# Patient Record
Sex: Female | Born: 1980 | Race: White | Hispanic: No | Marital: Married | Smoking: Never smoker
Health system: Southern US, Community
[De-identification: ages and names within clinical notes are randomized; demographics above are authoritative.]

## PROBLEM LIST (undated history)

## (undated) DIAGNOSIS — M5020 Other cervical disc displacement, unspecified cervical region: Secondary | ICD-10-CM

## (undated) DIAGNOSIS — K760 Fatty (change of) liver, not elsewhere classified: Secondary | ICD-10-CM

## (undated) DIAGNOSIS — G4730 Sleep apnea, unspecified: Secondary | ICD-10-CM

## (undated) DIAGNOSIS — M1990 Unspecified osteoarthritis, unspecified site: Secondary | ICD-10-CM

## (undated) DIAGNOSIS — G43909 Migraine, unspecified, not intractable, without status migrainosus: Secondary | ICD-10-CM

## (undated) DIAGNOSIS — M5030 Other cervical disc degeneration, unspecified cervical region: Secondary | ICD-10-CM

## (undated) DIAGNOSIS — K219 Gastro-esophageal reflux disease without esophagitis: Secondary | ICD-10-CM

## (undated) DIAGNOSIS — I10 Essential (primary) hypertension: Secondary | ICD-10-CM

## (undated) DIAGNOSIS — J45909 Unspecified asthma, uncomplicated: Secondary | ICD-10-CM

## (undated) DIAGNOSIS — R21 Rash and other nonspecific skin eruption: Secondary | ICD-10-CM

## (undated) HISTORY — DX: Migraine, unspecified, not intractable, without status migrainosus: G43.909

## (undated) HISTORY — DX: Unspecified asthma, uncomplicated: J45.909

## (undated) HISTORY — DX: Unspecified osteoarthritis, unspecified site: M19.90

## (undated) HISTORY — DX: Essential (primary) hypertension: I10

## (undated) HISTORY — DX: Other cervical disc degeneration, unspecified cervical region: M50.30

---

## 2005-10-25 ENCOUNTER — Emergency Department: Payer: Self-pay | Admitting: Emergency Medicine

## 2006-04-25 ENCOUNTER — Emergency Department: Payer: Self-pay | Admitting: Emergency Medicine

## 2006-12-01 HISTORY — PX: ABDOMINAL HYSTERECTOMY: SHX81

## 2007-06-03 ENCOUNTER — Emergency Department: Payer: Self-pay

## 2010-06-04 ENCOUNTER — Ambulatory Visit: Payer: Self-pay | Admitting: Unknown Physician Specialty

## 2010-06-06 ENCOUNTER — Ambulatory Visit: Payer: Self-pay | Admitting: Unknown Physician Specialty

## 2010-06-10 LAB — PATHOLOGY REPORT

## 2010-07-26 ENCOUNTER — Ambulatory Visit: Payer: Self-pay | Admitting: Unknown Physician Specialty

## 2013-11-21 ENCOUNTER — Emergency Department: Payer: Self-pay | Admitting: Emergency Medicine

## 2015-03-14 ENCOUNTER — Encounter: Payer: Self-pay | Admitting: *Deleted

## 2015-04-30 ENCOUNTER — Emergency Department
Admission: EM | Admit: 2015-04-30 | Discharge: 2015-04-30 | Disposition: A | Discharge status: Home / Self Care | Payer: 59 | Attending: Emergency Medicine | Admitting: Emergency Medicine

## 2015-04-30 ENCOUNTER — Encounter: Payer: Self-pay | Admitting: Emergency Medicine

## 2015-04-30 DIAGNOSIS — H1013 Acute atopic conjunctivitis, bilateral: Secondary | ICD-10-CM | POA: Insufficient documentation

## 2015-04-30 DIAGNOSIS — H578 Other specified disorders of eye and adnexa: Secondary | ICD-10-CM | POA: Diagnosis present

## 2015-04-30 MED ORDER — TETRACAINE HCL 0.5 % OP SOLN
OPHTHALMIC | Status: AC
  Filled 2015-04-30: qty 2

## 2015-04-30 MED ORDER — FLUORESCEIN SODIUM 1 MG OP STRP
ORAL_STRIP | OPHTHALMIC | Status: AC
  Filled 2015-04-30: qty 1

## 2015-04-30 MED ORDER — DIPHENHYDRAMINE HCL 50 MG PO CAPS
ORAL_CAPSULE | ORAL | Status: AC
  Administered 2015-04-30: 50 mg via ORAL
  Filled 2015-04-30: qty 1

## 2015-04-30 MED ORDER — DIPHENHYDRAMINE HCL 50 MG PO CAPS
50.0000 mg | ORAL_CAPSULE | Freq: Once | ORAL | Status: AC
  Administered 2015-04-30: 50 mg via ORAL

## 2015-04-30 MED ORDER — EYE WASH OPHTH SOLN
OPHTHALMIC | Status: AC
  Filled 2015-04-30: qty 118

## 2015-04-30 MED ORDER — OLOPATADINE HCL 0.1 % OP SOLN: 1.0000 [drp] | Freq: Two times a day (BID) | OPHTHALMIC | Status: DC

## 2015-04-30 NOTE — ED Notes (Signed)
Was at pet shop and held animals right before

## 2015-04-30 NOTE — ED Provider Notes (Signed)
[facility] Emergency Department Provider Note  ____________________________________________  Time seen: 1400  I have reviewed the triage vital signs and the nursing notes.   HISTORY  Chief Complaint Eye Problem   HPI Victoria Silva is a 34 y.o. female comes in after being at a pet shop in [place].  She states after having a rabbit and a [place] pig she walked out of the store and rubbed her eye. Shortly after that her right eye began to swell. There is been no change in vision. She has clear watery drainage from her right eye and it itches severely. She did not take any over-the-counter medication but came straight to the emergency room. She denies any previous allergies to animals. Currently she rates the pain mostly from itching 7 out of 10.   History reviewed. No pertinent past medical history.  There are no active problems to display for this patient.   No past surgical history on file.  Current Outpatient Rx  Name  Route  Sig  Dispense  Refill  . olopatadine (PATANOL) 0.1 % ophthalmic solution   Both Eyes   Place 1 drop into both eyes 2 (two) times daily.   5 mL   1     Allergies Review of patient's allergies indicates not on file.  No family history on file.  Social History History  Substance Use Topics  . Smoking status: Never Smoker   . Smokeless tobacco: Not on file  . Alcohol Use: No    Review of Systems Constitutional: No fever/chills Eyes: No visual changes. ENT: No sore throat. Cardiovascular: Denies chest pain. Respiratory: Denies shortness of breath. Gastrointestinal: No abdominal pain.  No nausea, no vomiting Skin: Negative for rash. Neurological: Negative for headaches, focal weakness or numbness.  10-point ROS otherwise negative.  ____________________________________________   PHYSICAL EXAM:  VITAL SIGNS: ED Triage Vitals  Enc Vitals Group     BP 04/30/15 1348 138/93 mmHg     Pulse Rate 04/30/15 1348 110     Resp 04/30/15 1348 20     Temp 04/30/15 1348 98.2 F (36.8 C)     Temp Source 04/30/15 1348 Oral     SpO2 04/30/15 1348 99 %     Weight 04/30/15 1348 215 lb (97.523 kg)     Height 04/30/15 1348  (1.575 m)     Head Cir --      Peak Flow --      Pain Score 04/30/15 1349 7     Pain Loc --      Pain Edu? --      Excl. in GC? --     Constitutional: Alert and oriented. Well appearing and in no acute distress. Eyes: Right conjunctiva is moderately injected with clear watery drainage from it. There is some mild scleral edema. No foreign body was noted. PERRL. EOMI. Head: Atraumatic. Nose: No congestion/rhinnorhea. Neck: No stridor.  Supple Hematological/Lymphatic/Immunilogical: No cervical lymphadenopathy. Cardiovascular: Normal rate, regular rhythm. Grossly normal heart sounds.  Good peripheral circulation. Respiratory: Normal respiratory effort.  No retractions. Lungs CTAB. Musculoskeletal: No lower extremity tenderness nor edema.  No joint effusions. Neurologic:  Normal speech and language. No gross focal neurologic deficits are appreciated. Speech is normal. No gait instability. Skin:  Skin is warm, dry and intact. No rash noted. Psychiatric: Mood and affect are normal. Speech and behavior are normal.  ____________________________________________   LABS (all labs ordered are listed, but only abnormal results are displayed)  Labs Reviewed - No  data to display ____________________________________________  PROCEDURES  Procedure(s) performed: Tetracaine was dropped in both eyes. Lids were inverted bilaterally with no foreign body noted. Fluro-dye was placed bilaterally without any corneal abrasions noted. Both eyes were irrigated with eyewash.  Critical Care performed: No  ____________________________________________   INITIAL IMPRESSION / ASSESSMENT AND PLAN / ED COURSE  Pertinent labs & imaging results that were available during my care of the patient were reviewed by  me and considered in my medical decision making (see chart for details).  Patient was given Benadryl 50 mg by mouth. After I exam she was prescribed Patanol twice a day. His continue using Benadryl as needed for itching and follow-up with [facility] if any continued problems. ____________________________________________   FINAL CLINICAL IMPRESSION(S) / ED DIAGNOSES  Final diagnoses:  Allergic conjunctivitis of both eyes      Tommi RumpsRhonda L Summers, PA-C 04/30/15 1619  Darien Ramusavid W Kaminski, MD 05/01/15 1210

## 2015-04-30 NOTE — Discharge Instructions (Signed)
Allergic Conjunctivitis  The conjunctiva is a thin membrane that covers the visible white part of the eyeball and the underside of the eyelids. This membrane protects and lubricates the eye. The membrane has small blood vessels running through it that can normally be seen. When the conjunctiva becomes inflamed, the condition is called conjunctivitis. In response to the inflammation, the conjunctival blood vessels become swollen. The swelling results in redness in the normally white part of the eye.  The blood vessels of this membrane also react when a person has allergies and is then called allergic conjunctivitis. This condition usually lasts for as long as the allergy persists. Allergic conjunctivitis cannot be passed to another person (non-contagious). The likelihood of bacterial infection is great and the cause is not likely due to allergies if the inflamed eye has:  · A sticky discharge.  · Discharge or sticking together of the lids in the morning.  · Scaling or flaking of the eyelids where the eyelashes come out.  · Red swollen eyelids.  CAUSES   · Viruses.  · Irritants such as foreign bodies.  · Chemicals.  · General allergic reactions.  · Inflammation or serious diseases in the inside or the outside of the eye or the orbit (the boney cavity in which the eye sits) can cause a "red eye."  SYMPTOMS   · Eye redness.  · Tearing.  · Itchy eyes.  · Burning feeling in the eyes.  · Clear drainage from the eye.  · Allergic reaction due to pollens or ragweed sensitivity. Seasonal allergic conjunctivitis is frequent in the spring when pollens are in the air and in the fall.  DIAGNOSIS   This condition, in its many forms, is usually diagnosed based on the history and an ophthalmological exam. It usually involves both eyes. If your eyes react at the same time every year, allergies may be the cause. While most "red eyes" are due to allergy or an infection, the role of an eye (ophthalmological) exam is important. The exam  can rule out serious diseases of the eye or orbit.  TREATMENT   · Non-antibiotic eye drops, ointments, or medications by mouth may be prescribed if the ophthalmologist is sure the conjunctivitis is due to allergies alone.  · Over-the-counter drops and ointments for allergic symptoms should be used only after other causes of conjunctivitis have been ruled out, or as your caregiver suggests.  Medications by mouth are often prescribed if other allergy-related symptoms are present. If the ophthalmologist is sure that the conjunctivitis is due to allergies alone, treatment is normally limited to drops or ointments to reduce itching and burning.  HOME CARE INSTRUCTIONS   · Wash hands before and after applying drops or ointments, or touching the inflamed eye(s) or eyelids.  · Do not let the eye dropper tip or ointment tube touch the eyelid when putting medicine in your eye.  · Stop using your soft contact lenses and throw them away. Use a new pair of lenses when recovery is complete. You should run through sterilizing cycles at least three times before use after complete recovery if the old soft contact lenses are to be used. Hard contact lenses should be stopped. They need to be thoroughly sterilized before use after recovery.  · Itching and burning eyes due to allergies is often relieved by using a cool cloth applied to closed eye(s).  SEEK MEDICAL CARE IF:   · Your problems do not go away after two or three days of treatment.  ·   Your lids are sticky (especially in the morning when you wake up) or stick together.  · Discharge develops. Antibiotics may be needed either as drops, ointment, or by mouth.  · You have extreme light sensitivity.  · An oral temperature above 102° F (38.9° C) develops.  · Pain in or around the eye or any other visual symptom develops.  MAKE SURE YOU:   · Understand these instructions.  · Will watch your condition.  · Will get help right away if you are not doing well or get worse.  Document  Released: 02/07/2003 Document Revised: 02/09/2012 Document Reviewed: 01/03/2008  ExitCare® Patient Information ©2015 [employer]. This information is not intended to replace advice given to you by your health care provider. Make sure you discuss any questions you have with your health care provider.

## 2015-08-09 ENCOUNTER — Other Ambulatory Visit: Payer: Self-pay | Admitting: Internal Medicine

## 2015-08-09 DIAGNOSIS — M542 Cervicalgia: Principal | ICD-10-CM

## 2015-08-09 DIAGNOSIS — G8929 Other chronic pain: Secondary | ICD-10-CM

## 2015-08-10 ENCOUNTER — Other Ambulatory Visit: Payer: Self-pay | Admitting: Internal Medicine

## 2015-08-10 DIAGNOSIS — N6452 Nipple discharge: Secondary | ICD-10-CM

## 2015-08-10 DIAGNOSIS — N644 Mastodynia: Secondary | ICD-10-CM

## 2015-08-15 ENCOUNTER — Ambulatory Visit
Admission: RE | Admit: 2015-08-15 | Discharge: 2015-08-15 | Disposition: A | Discharge status: Home / Self Care | Payer: 59 | Source: Ambulatory Visit | Attending: Internal Medicine | Admitting: Internal Medicine

## 2015-08-15 DIAGNOSIS — G8929 Other chronic pain: Secondary | ICD-10-CM | POA: Insufficient documentation

## 2015-08-15 DIAGNOSIS — M542 Cervicalgia: Secondary | ICD-10-CM | POA: Diagnosis present

## 2015-08-16 ENCOUNTER — Ambulatory Visit
Admission: RE | Admit: 2015-08-16 | Discharge: 2015-08-16 | Disposition: A | Discharge status: Home / Self Care | Payer: 59 | Source: Ambulatory Visit | Attending: Internal Medicine | Admitting: Internal Medicine

## 2015-08-16 ENCOUNTER — Other Ambulatory Visit: Payer: Self-pay | Admitting: Internal Medicine

## 2015-08-16 DIAGNOSIS — N6452 Nipple discharge: Secondary | ICD-10-CM | POA: Diagnosis not present

## 2015-08-16 DIAGNOSIS — N644 Mastodynia: Secondary | ICD-10-CM

## 2015-09-05 ENCOUNTER — Other Ambulatory Visit: Payer: Self-pay | Admitting: Internal Medicine

## 2015-09-05 DIAGNOSIS — R519 Headache, unspecified: Secondary | ICD-10-CM

## 2015-09-05 DIAGNOSIS — R51 Headache: Principal | ICD-10-CM

## 2015-09-12 ENCOUNTER — Ambulatory Visit
Admission: RE | Admit: 2015-09-12 | Discharge: 2015-09-12 | Disposition: A | Discharge status: Home / Self Care | Payer: 59 | Source: Ambulatory Visit | Attending: Internal Medicine | Admitting: Internal Medicine

## 2015-09-12 DIAGNOSIS — R51 Headache: Secondary | ICD-10-CM | POA: Insufficient documentation

## 2015-09-12 DIAGNOSIS — G8929 Other chronic pain: Secondary | ICD-10-CM

## 2015-10-22 ENCOUNTER — Ambulatory Visit: Payer: 59 | Admitting: Physical Therapy

## 2015-10-30 ENCOUNTER — Ambulatory Visit: Payer: 59 | Admitting: Physical Therapy

## 2015-10-31 ENCOUNTER — Ambulatory Visit: Payer: 59 | Admitting: Physical Therapy

## 2015-12-13 DIAGNOSIS — R740 Nonspecific elevation of levels of transaminase and lactic acid dehydrogenase [LDH]: Secondary | ICD-10-CM | POA: Diagnosis not present

## 2015-12-13 DIAGNOSIS — R7303 Prediabetes: Secondary | ICD-10-CM | POA: Diagnosis not present

## 2015-12-13 DIAGNOSIS — L74519 Primary focal hyperhidrosis, unspecified: Secondary | ICD-10-CM | POA: Diagnosis not present

## 2015-12-17 DIAGNOSIS — L74519 Primary focal hyperhidrosis, unspecified: Secondary | ICD-10-CM | POA: Diagnosis not present

## 2016-01-03 DIAGNOSIS — L74519 Primary focal hyperhidrosis, unspecified: Secondary | ICD-10-CM | POA: Diagnosis not present

## 2016-01-03 DIAGNOSIS — R825 Elevated urine levels of drugs, medicaments and biological substances: Secondary | ICD-10-CM | POA: Diagnosis not present

## 2016-01-03 DIAGNOSIS — R7989 Other specified abnormal findings of blood chemistry: Secondary | ICD-10-CM | POA: Diagnosis not present

## 2016-01-24 DIAGNOSIS — M62838 Other muscle spasm: Secondary | ICD-10-CM | POA: Diagnosis not present

## 2016-01-24 DIAGNOSIS — R740 Nonspecific elevation of levels of transaminase and lactic acid dehydrogenase [LDH]: Secondary | ICD-10-CM | POA: Diagnosis not present

## 2016-01-24 DIAGNOSIS — M5030 Other cervical disc degeneration, unspecified cervical region: Secondary | ICD-10-CM | POA: Diagnosis not present

## 2016-01-24 DIAGNOSIS — M5412 Radiculopathy, cervical region: Secondary | ICD-10-CM | POA: Diagnosis not present

## 2016-03-05 ENCOUNTER — Other Ambulatory Visit: Payer: Self-pay | Admitting: Bariatrics

## 2016-03-05 DIAGNOSIS — R5383 Other fatigue: Secondary | ICD-10-CM | POA: Diagnosis not present

## 2016-03-05 DIAGNOSIS — G43719 Chronic migraine without aura, intractable, without status migrainosus: Secondary | ICD-10-CM | POA: Diagnosis not present

## 2016-03-05 DIAGNOSIS — R5381 Other malaise: Secondary | ICD-10-CM | POA: Diagnosis not present

## 2016-03-05 DIAGNOSIS — I10 Essential (primary) hypertension: Secondary | ICD-10-CM | POA: Diagnosis not present

## 2016-03-05 DIAGNOSIS — G4719 Other hypersomnia: Secondary | ICD-10-CM | POA: Diagnosis not present

## 2016-03-05 DIAGNOSIS — R0683 Snoring: Secondary | ICD-10-CM | POA: Diagnosis not present

## 2016-03-05 DIAGNOSIS — K219 Gastro-esophageal reflux disease without esophagitis: Secondary | ICD-10-CM | POA: Diagnosis not present

## 2016-03-11 ENCOUNTER — Other Ambulatory Visit: Payer: Self-pay | Admitting: Neurology

## 2016-03-11 DIAGNOSIS — R42 Dizziness and giddiness: Secondary | ICD-10-CM

## 2016-03-11 DIAGNOSIS — G43709 Chronic migraine without aura, not intractable, without status migrainosus: Secondary | ICD-10-CM

## 2016-03-11 DIAGNOSIS — IMO0002 Reserved for concepts with insufficient information to code with codable children: Secondary | ICD-10-CM

## 2016-04-01 ENCOUNTER — Ambulatory Visit: Admission: RE | Admit: 2016-04-01 | Payer: 59 | Source: Ambulatory Visit

## 2016-04-10 ENCOUNTER — Ambulatory Visit: Payer: 59 | Attending: Neurology

## 2016-04-10 DIAGNOSIS — G4733 Obstructive sleep apnea (adult) (pediatric): Secondary | ICD-10-CM | POA: Insufficient documentation

## 2016-04-10 DIAGNOSIS — Z6841 Body Mass Index (BMI) 40.0 and over, adult: Secondary | ICD-10-CM | POA: Insufficient documentation

## 2016-04-15 DIAGNOSIS — G4733 Obstructive sleep apnea (adult) (pediatric): Secondary | ICD-10-CM | POA: Diagnosis not present

## 2016-04-16 ENCOUNTER — Telehealth: Payer: 59 | Admitting: Family

## 2016-04-16 DIAGNOSIS — J069 Acute upper respiratory infection, unspecified: Secondary | ICD-10-CM | POA: Diagnosis not present

## 2016-04-16 MED ORDER — AZITHROMYCIN 250 MG PO TABS: ORAL_TABLET | ORAL | Status: DC

## 2016-04-16 MED ORDER — BENZONATATE 100 MG PO CAPS: 100.0000 mg | ORAL_CAPSULE | Freq: Three times a day (TID) | ORAL | Status: DC | PRN

## 2016-04-16 NOTE — Progress Notes (Signed)

## 2016-04-18 ENCOUNTER — Other Ambulatory Visit: Payer: 59

## 2016-04-18 ENCOUNTER — Ambulatory Visit: Payer: 59 | Attending: Bariatrics

## 2016-04-19 ENCOUNTER — Telehealth: Payer: 59 | Admitting: Nurse Practitioner

## 2016-04-19 DIAGNOSIS — R059 Cough, unspecified: Secondary | ICD-10-CM

## 2016-04-19 DIAGNOSIS — R05 Cough: Secondary | ICD-10-CM

## 2016-04-19 MED ORDER — LEVOFLOXACIN 500 MG PO TABS: 500.0000 mg | ORAL_TABLET | Freq: Every day | ORAL | Status: DC

## 2016-04-19 MED ORDER — PREDNISONE 10 MG (21) PO TBPK: 10.0000 mg | ORAL_TABLET | Freq: Every day | ORAL | Status: DC

## 2016-04-19 NOTE — Progress Notes (Signed)
We are sorry that you are not feeling well.  Here is how we plan to help!  Based on what you have shared with me it looks like you have upper respiratory tract inflammation that has resulted in a significant cough.  Inflammation and infection in the upper respiratory tract is commonly called bronchitis and has four common causes:  Allergies, Viral Infections, Acid Reflux and Bacterial Infections.  Allergies, viruses and acid reflux are treated by controlling symptoms or eliminating the cause. An example might be a cough caused by taking certain blood pressure medications. You stop the cough by changing the medication. Another example might be a cough caused by acid reflux. Controlling the reflux helps control the cough.  Based on your presentation I believe you most likely have A cough due to bacteria.  When patients have a fever and a productive cough with a change in color or increased sputum production, we are concerned about bacterial bronchitis.  If left untreated it can progress to pneumonia.  If your symptoms do not improve with your treatment plan it is important that you contact your provider.   I hve prescribed Levofloxacin 500 mg daily for 7 days   In addition you may use A prescription cough medication called Tessalon Perles 100mg . You may take 1-2 capsules every 8 hours as needed for your cough. And also prednisone taper pack.    HOME CARE . Only take medications as instructed by your medical team. . Complete the entire course of an antibiotic. . Drink plenty of fluids and get plenty of rest. . Avoid close contacts especially the very young and the elderly . Cover your mouth if you cough or cough into your sleeve. . Always remember to wash your hands . A steam or ultrasonic humidifier can help congestion.    GET HELP RIGHT AWAY IF: . You develop worsening fever. . You become short of breath . You cough up blood. . Your symptoms persist after you have completed your treatment  plan MAKE SURE YOU   Understand these instructions.  Will watch your condition.  Will get help right away if you are not doing well or get worse.  Your e-visit answers were reviewed by a board certified advanced clinical practitioner to complete your personal care plan.  Depending on the condition, your plan could have included both over the counter or prescription medications. If there is a problem please reply  once you have received a response from your provider. Your safety is important to us.  If you have drug allergies check your prescription carefully.    You can use MyChart to ask questions about today's visit, request a non-urgent call back, or ask for a work or school excuse for 24 hours related to this e-Visit. If it has been greater than 24 hours you will need to follow up with your provider, or enter a new e-Visit to address those concerns. You will get an e-mail in the next two days asking about your experience.  I hope that your e-visit has been valuable and will speed your recovery. Thank you for using e-visits.

## 2016-05-02 ENCOUNTER — Ambulatory Visit
Admission: RE | Admit: 2016-05-02 | Discharge: 2016-05-02 | Disposition: A | Discharge status: Home / Self Care | Payer: 59 | Source: Ambulatory Visit | Attending: Neurology | Admitting: Neurology

## 2016-05-02 DIAGNOSIS — R42 Dizziness and giddiness: Secondary | ICD-10-CM | POA: Diagnosis not present

## 2016-05-02 DIAGNOSIS — G43709 Chronic migraine without aura, not intractable, without status migrainosus: Secondary | ICD-10-CM | POA: Insufficient documentation

## 2016-05-02 DIAGNOSIS — IMO0002 Reserved for concepts with insufficient information to code with codable children: Secondary | ICD-10-CM

## 2016-05-02 MED ORDER — GADOBENATE DIMEGLUMINE 529 MG/ML IV SOLN
20.0000 mL | Freq: Once | INTRAVENOUS | Status: AC | PRN
  Administered 2016-05-02: 20 mL via INTRAVENOUS

## 2016-05-15 DIAGNOSIS — G4733 Obstructive sleep apnea (adult) (pediatric): Secondary | ICD-10-CM | POA: Diagnosis not present

## 2016-05-15 DIAGNOSIS — G43719 Chronic migraine without aura, intractable, without status migrainosus: Secondary | ICD-10-CM | POA: Diagnosis not present

## 2016-06-12 ENCOUNTER — Ambulatory Visit: Payer: 59

## 2016-09-10 DIAGNOSIS — J4521 Mild intermittent asthma with (acute) exacerbation: Secondary | ICD-10-CM | POA: Diagnosis not present

## 2016-09-10 DIAGNOSIS — Z1329 Encounter for screening for other suspected endocrine disorder: Secondary | ICD-10-CM | POA: Diagnosis not present

## 2016-09-10 DIAGNOSIS — Z79899 Other long term (current) drug therapy: Secondary | ICD-10-CM | POA: Diagnosis not present

## 2016-09-10 DIAGNOSIS — E282 Polycystic ovarian syndrome: Secondary | ICD-10-CM | POA: Diagnosis not present

## 2016-09-10 DIAGNOSIS — Z0000 Encounter for general adult medical examination without abnormal findings: Secondary | ICD-10-CM | POA: Diagnosis not present

## 2016-09-10 DIAGNOSIS — I10 Essential (primary) hypertension: Secondary | ICD-10-CM | POA: Diagnosis not present

## 2016-09-11 ENCOUNTER — Other Ambulatory Visit: Payer: Self-pay | Admitting: Internal Medicine

## 2016-09-11 DIAGNOSIS — M79604 Pain in right leg: Secondary | ICD-10-CM

## 2016-09-17 ENCOUNTER — Ambulatory Visit
Admission: RE | Admit: 2016-09-17 | Discharge: 2016-09-17 | Disposition: A | Discharge status: Home / Self Care | Payer: 59 | Source: Ambulatory Visit | Attending: Internal Medicine | Admitting: Internal Medicine

## 2016-09-17 ENCOUNTER — Ambulatory Visit: Payer: 59

## 2016-09-17 DIAGNOSIS — M7989 Other specified soft tissue disorders: Secondary | ICD-10-CM | POA: Diagnosis not present

## 2016-09-17 DIAGNOSIS — M79604 Pain in right leg: Secondary | ICD-10-CM | POA: Diagnosis not present

## 2016-10-27 ENCOUNTER — Other Ambulatory Visit: Payer: Self-pay | Admitting: Nurse Practitioner

## 2016-10-27 DIAGNOSIS — R748 Abnormal levels of other serum enzymes: Secondary | ICD-10-CM

## 2016-10-27 DIAGNOSIS — R109 Unspecified abdominal pain: Secondary | ICD-10-CM

## 2016-10-31 ENCOUNTER — Ambulatory Visit: Admission: RE | Admit: 2016-10-31 | Payer: 59 | Source: Ambulatory Visit

## 2016-11-17 DIAGNOSIS — M5030 Other cervical disc degeneration, unspecified cervical region: Secondary | ICD-10-CM | POA: Diagnosis not present

## 2016-11-17 DIAGNOSIS — M62838 Other muscle spasm: Secondary | ICD-10-CM | POA: Diagnosis not present

## 2016-11-17 DIAGNOSIS — M5412 Radiculopathy, cervical region: Secondary | ICD-10-CM | POA: Diagnosis not present

## 2017-01-08 ENCOUNTER — Ambulatory Visit
Admission: RE | Admit: 2017-01-08 | Discharge: 2017-01-08 | Disposition: A | Discharge status: Home / Self Care | Payer: 59 | Source: Ambulatory Visit | Attending: Nurse Practitioner | Admitting: Nurse Practitioner

## 2017-01-08 DIAGNOSIS — K824 Cholesterolosis of gallbladder: Secondary | ICD-10-CM | POA: Insufficient documentation

## 2017-01-08 DIAGNOSIS — R109 Unspecified abdominal pain: Secondary | ICD-10-CM | POA: Insufficient documentation

## 2017-01-08 DIAGNOSIS — K760 Fatty (change of) liver, not elsewhere classified: Secondary | ICD-10-CM | POA: Insufficient documentation

## 2017-01-08 DIAGNOSIS — R748 Abnormal levels of other serum enzymes: Secondary | ICD-10-CM | POA: Diagnosis not present

## 2017-01-12 DIAGNOSIS — R1013 Epigastric pain: Secondary | ICD-10-CM | POA: Diagnosis not present

## 2017-01-12 DIAGNOSIS — R6889 Other general symptoms and signs: Secondary | ICD-10-CM | POA: Diagnosis not present

## 2017-02-05 ENCOUNTER — Ambulatory Visit (INDEPENDENT_AMBULATORY_CARE_PROVIDER_SITE_OTHER): Payer: 59 | Admitting: Cardiovascular Disease

## 2017-02-05 ENCOUNTER — Encounter: Payer: Self-pay | Admitting: Cardiovascular Disease

## 2017-02-05 VITALS — BP 112/70 | HR 64 | Ht 61.0 in | Wt 232.0 lb

## 2017-02-05 DIAGNOSIS — R109 Unspecified abdominal pain: Secondary | ICD-10-CM

## 2017-02-05 DIAGNOSIS — K760 Fatty (change of) liver, not elsewhere classified: Secondary | ICD-10-CM | POA: Diagnosis not present

## 2017-02-05 DIAGNOSIS — R748 Abnormal levels of other serum enzymes: Secondary | ICD-10-CM | POA: Diagnosis not present

## 2017-02-05 DIAGNOSIS — G8929 Other chronic pain: Secondary | ICD-10-CM | POA: Insufficient documentation

## 2017-02-05 DIAGNOSIS — E282 Polycystic ovarian syndrome: Secondary | ICD-10-CM | POA: Insufficient documentation

## 2017-02-05 DIAGNOSIS — I10 Essential (primary) hypertension: Secondary | ICD-10-CM | POA: Diagnosis not present

## 2017-02-05 DIAGNOSIS — J4520 Mild intermittent asthma, uncomplicated: Secondary | ICD-10-CM | POA: Insufficient documentation

## 2017-02-05 HISTORY — DX: Morbid (severe) obesity due to excess calories: E66.01

## 2017-02-05 NOTE — Progress Notes (Signed)
Cardiology Office Note  Date:  02/05/2017   ID:  Craig GuessMonique Danielle Caley, DOB [birth year], MRN [id number]  PCP:  Marguarite ArbourSPARKS,JEFFREY D, MD   Chief Complaint  Patient presents with  . other    New patient. Per Fransico SettersKim Mills, NP patient needs cardiac clearance for EGD. Meds reviewed verbally with patient.     HPI:  Ms. Victoria Silva is a 36 year old woman with past medical history as detailed below who presents by referral from Dr. Arvilla MarketMills for consultation of preop clearance for  gastric bypass surgery. Essential hypertension Morbid obesity, history of sleep apnea History of musculoskeletal disorder, neck History of elevated LFTs seen by GI, Chronic epigastric pain Fatty liver Polycystic ovarian syndrome Mild intermittent asthma  Patient indicates she would like gastric sleeve or maybe bypass surgery Notes indicate she has severe nausea, sweats, epigastric pain after eating and this occurs 4-5 times per week, likely having gallbladder attacks  Ultrasound 01/08/2017 for elevated LFTs revealed 5 mm gallbladder polyp, no gallstones or gallbladder wall thickening  Still having ABD pain, after food, sometimes first thing in the AM, 3x per week Rarely overnight 30 min ordeal, Symptoms typically resolve after she has one or more bowel movements No symptoms on exertion  She is very active at baseline, has 3 children who are teenagers No regular exercise program but reports staying very active, she is a nurse in the hospital, 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  PSH:    Past Surgical History:  Procedure Laterality Date  . ABDOMINAL  HYSTERECTOMY  2008   uterus removed only  . CESAREAN SECTION      Current Outpatient Prescriptions  Medication Sig Dispense Refill  . bisoprolol-hydrochlorothiazide (ZIAC) 5-6.25 MG tablet Take 5-6.25 tablets by mouth daily.  11  . gabapentin (NEURONTIN) 300 MG capsule Take 300 mg by mouth at bedtime.  11  . magnesium oxide (MAG-OX) 400 MG tablet Take 400 mg by mouth daily.    . predniSONE (STERAPRED UNI-PAK 21 TAB) 10 MG (21) TBPK tablet Take 1 tablet (10 mg total) by mouth daily. As directed x 6 days 21 tablet 0  . SUMAtriptan (IMITREX) 100 MG tablet Take 100 mg by mouth daily as needed.  6  . tiZANidine (ZANAFLEX) 4 MG tablet Take 4 mg by mouth every morning.  5  . traMADol (ULTRAM) 50 MG tablet Take 50 mg by mouth 2 (two) times daily as needed.  5   No current facility-administered medications for this visit.      Allergies:   Patient has no known allergies.   Social History:  The patient  reports that she has never smoked. She has never used smokeless tobacco. She reports that she does not drink alcohol or use drugs.   Family History:   family history includes Diabetes Mellitus I in her mother; Hyperlipidemia in her maternal grandmother; Hypertension in her maternal grandmother.    Review of Systems: Review of Systems  Constitutional: Negative.   Respiratory: Negative.   Cardiovascular: Negative.   Gastrointestinal: Positive for abdominal pain and nausea.  Musculoskeletal: Negative.   Neurological: Negative.   Psychiatric/Behavioral: Negative.   All  other systems reviewed and are negative.    PHYSICAL EXAM: VS:  BP 112/70 (BP Location: Right Arm, Patient Position: Sitting, Cuff Size: Normal)   Pulse 64   Ht 5\' 1"  (1.549 m)   Wt 232 lb (105.2 kg)   BMI 43.84 kg/m  , BMI Body mass index is 43.84 kg/m. GEN: Well nourished, well developed, in no acute distress, obese  HEENT: normal  Neck: no JVD, carotid bruits, or masses Cardiac: RRR; no murmurs, rubs, or  gallops,no edema  Respiratory:  clear to auscultation bilaterally, normal work of breathing GI: soft, nontender, nondistended, + BS MS: no deformity or atrophy  Skin: warm and dry, no rash Neuro:  Strength and sensation are intact Psych: euthymic mood, full affect    Recent Labs: No results found for requested labs within last 8760 hours.    Lipid Panel No results found for: CHOL, HDL, LDLCALC, TRIG    Wt Readings from Last 3 Encounters:  02/05/17 232 lb (105.2 kg)  08/15/15 215 lb (97.5 kg)  04/30/15 215 lb (97.5 kg)       ASSESSMENT AND PLAN:  Essential hypertension - Plan: EKG 12-Lead Notes indicating history of hypertension. Well-controlled on her current medication regimen Bradycardia side effect from taking bisoprolol. She is asymptomatic and prefers to stay on the medications as this helps her migraines  Morbid obesity (HCC) - Plan: EKG 12-Lead She is interested in gastric procedure for weight loss Feels it would help her sleep apnea, helps her joint pain in her knees, help her breathing, fluid retention, hypertension. Hemoglobin A1c 5.9, borderline She would likely greatly benefit from a procedure and minimize long-term cardiovascular risk  Elevated liver enzymes - Plan: EKG 12-Lead Improvement in her liver function tests on recent lab work  Chronic abdominal pain - Plan: EKG 12-Lead Suspect she has IBS, abdominal pain typically after she eats, first thing in the morning, symptoms relieved after bowel movement or 2. Recommended she start high fiber diet,  discuss with GI  Mild intermittent asthma without complication - Plan: EKG 12-Lead Reports asthma has been stable, no flareup  Polycystic ovarian syndrome - Plan: EKG 12-Lead  Fatty liver - Plan: EKG 12-Lead As above, has minimally elevated LFTs, improved recently on lab work  Preop cardiovascular She'll be except risk for any gastric surgery bypass procedure Very low risk in terms of her cardiac issues,  no risk factors Abdominal discomfort secondary to GI etiology No further testing needed at this time  Disposition:   F/U  as needed   Total encounter time more than 45 minutes  Greater than 50% was spent in counseling and coordination of care with the patient    Orders Placed This Encounter  Procedures  . EKG 12-Lead     Signed, Dossie Arbour, M.D., Ph.D. 02/05/2017  [facility] [place], [place] [phone]

## 2017-02-05 NOTE — Patient Instructions (Addendum)
Medication Instructions:   No medication changes made  Add potassium to your diet  Labwork:  No new labs needed  Testing/Procedures:  No further testing at this time   I recommend watching educational videos on topics of interest to you at:       [email or web address]  Enter code: [email or web address]    Follow-Up: It was a pleasure seeing you in the office today. Please call us if you have new issues that need to be addressed before your next appt.  [phone]  Your physician wants you to follow-up in: as needed  If you need a refill on your cardiac medications before your next appointment, please call your pharmacy.

## 2017-03-04 DIAGNOSIS — G4733 Obstructive sleep apnea (adult) (pediatric): Secondary | ICD-10-CM | POA: Diagnosis not present

## 2017-03-04 DIAGNOSIS — R5381 Other malaise: Secondary | ICD-10-CM | POA: Diagnosis not present

## 2017-03-04 DIAGNOSIS — R5383 Other fatigue: Secondary | ICD-10-CM | POA: Diagnosis not present

## 2017-03-04 DIAGNOSIS — I10 Essential (primary) hypertension: Secondary | ICD-10-CM | POA: Diagnosis not present

## 2017-03-06 ENCOUNTER — Telehealth: Payer: Self-pay | Admitting: Cardiovascular Disease

## 2017-03-06 NOTE — Telephone Encounter (Signed)
Received cardiac clearance request/ROI from [facility]. Dr. Windell Hummingbird last ov routed to [facility] @ [phone].

## 2017-04-09 DIAGNOSIS — Z713 Dietary counseling and surveillance: Secondary | ICD-10-CM | POA: Diagnosis not present

## 2017-04-30 DIAGNOSIS — F4322 Adjustment disorder with anxiety: Secondary | ICD-10-CM | POA: Diagnosis not present

## 2017-05-07 IMAGING — US US ABDOMEN COMPLETE
1 series · 15 of 25 positions shown · non-contrast
Comparison: None.

CLINICAL DATA: Elevated liver enzymes.

EXAM:
ABDOMEN ULTRASOUND COMPLETE

[Series 1: us abdomen complete · 15 of 88 slices shown]
[im 1/88]
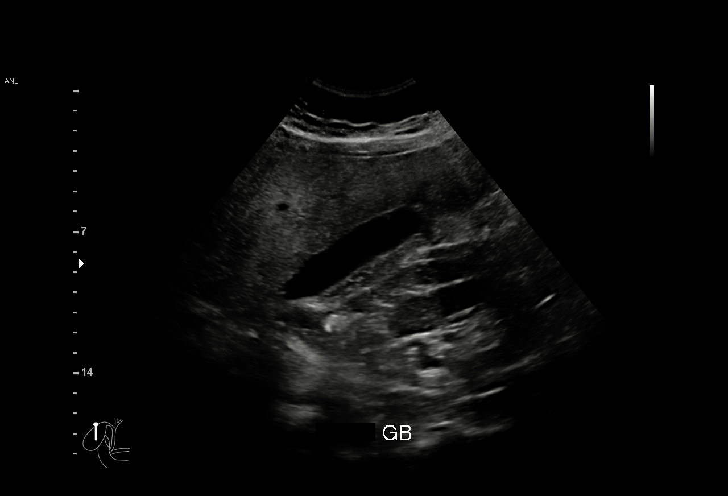
[im 8/88]
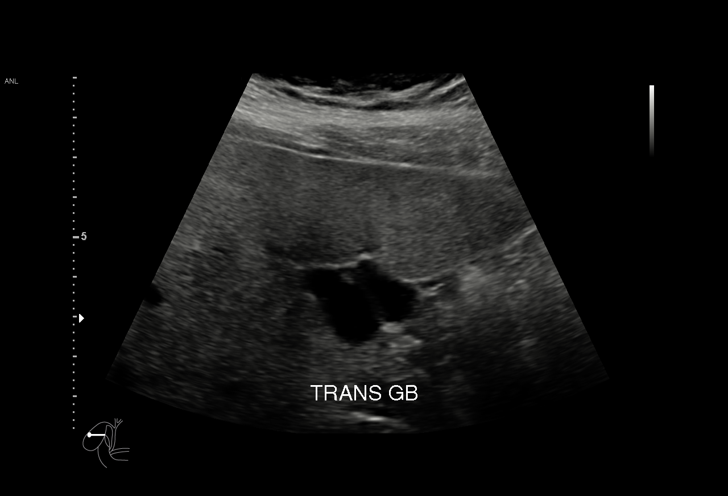
[im 15/88]
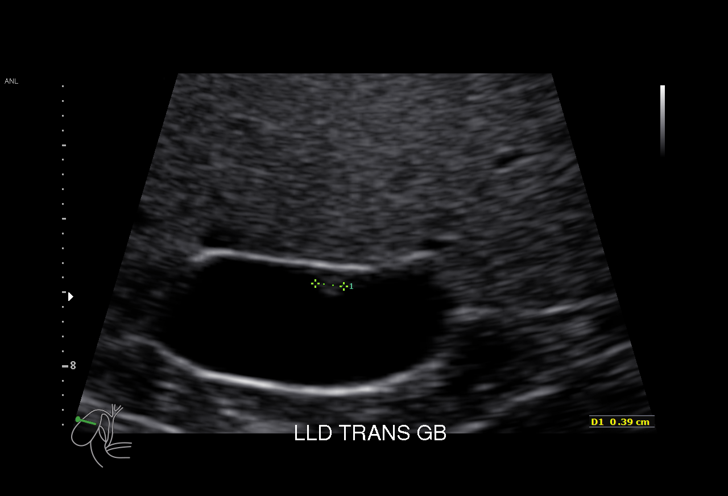
[im 19/88]
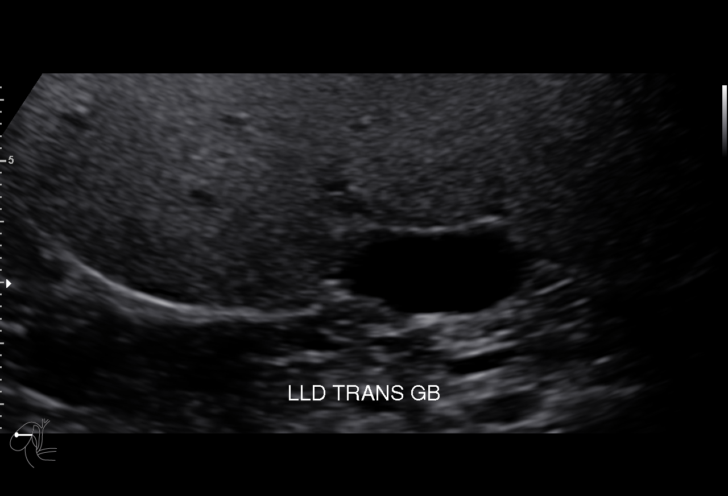
[im 26/88]
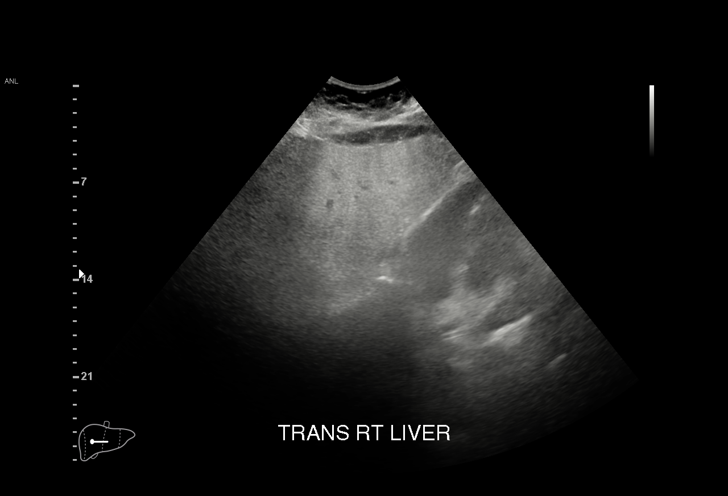
[im 33/88]
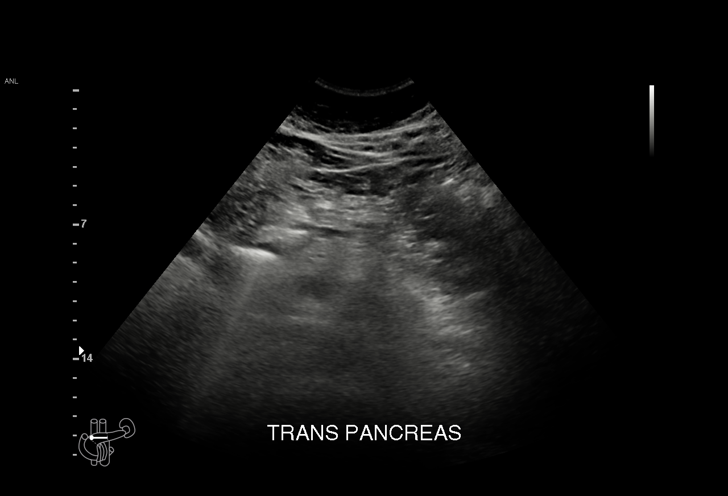
[im 37/88]
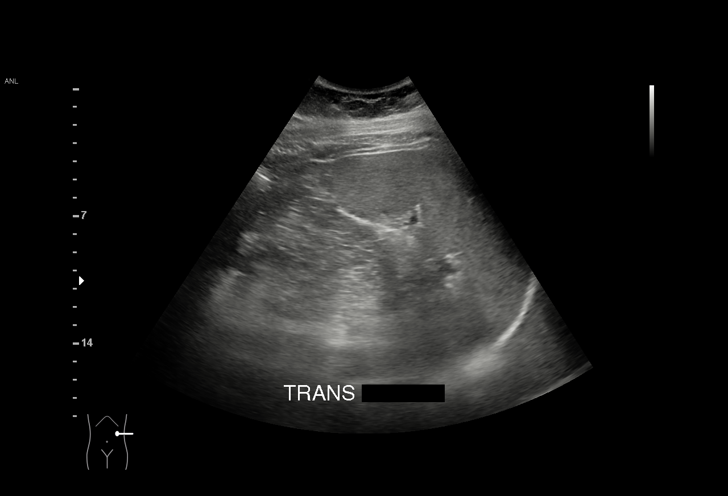
[im 44/88]
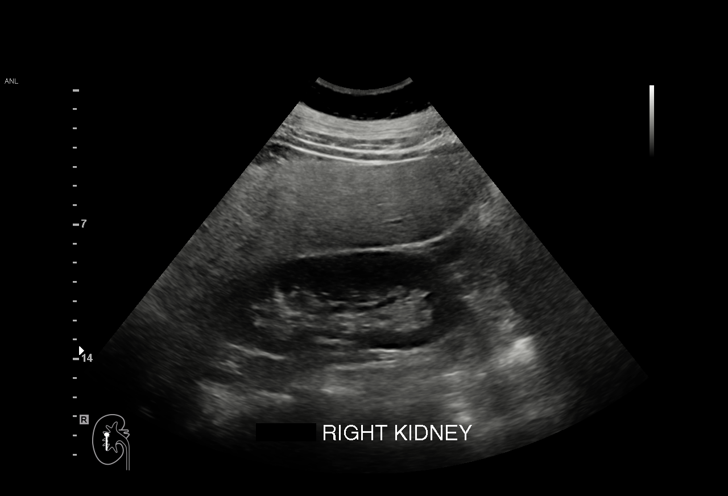
[im 51/88]
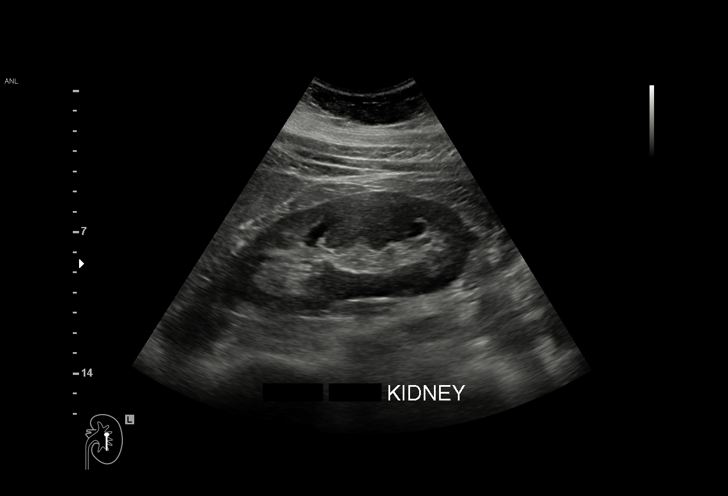
[im 55/88]
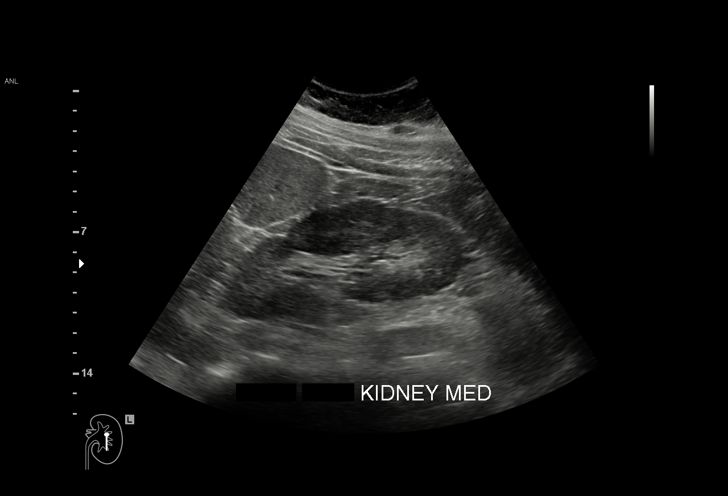
[im 62/88]
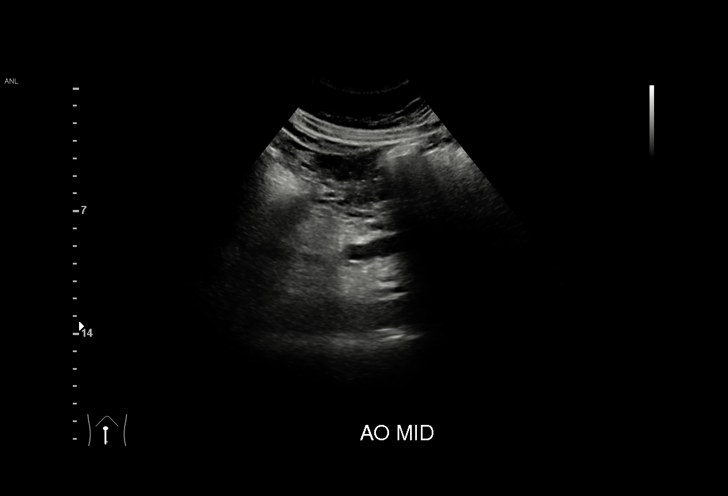
[im 69/88]
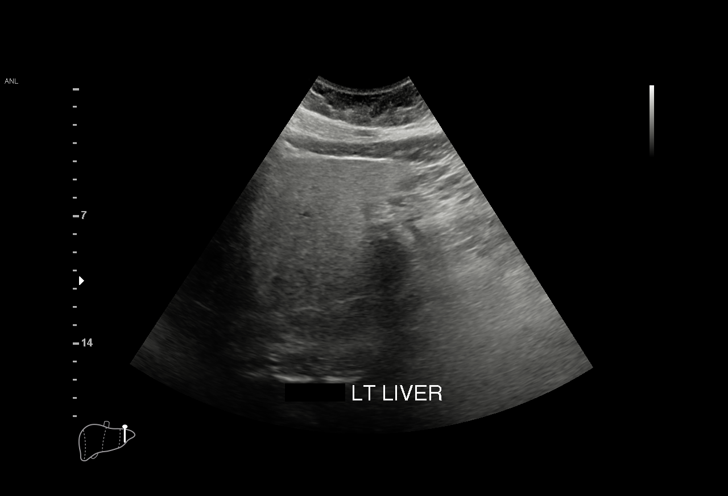
[im 73/88]
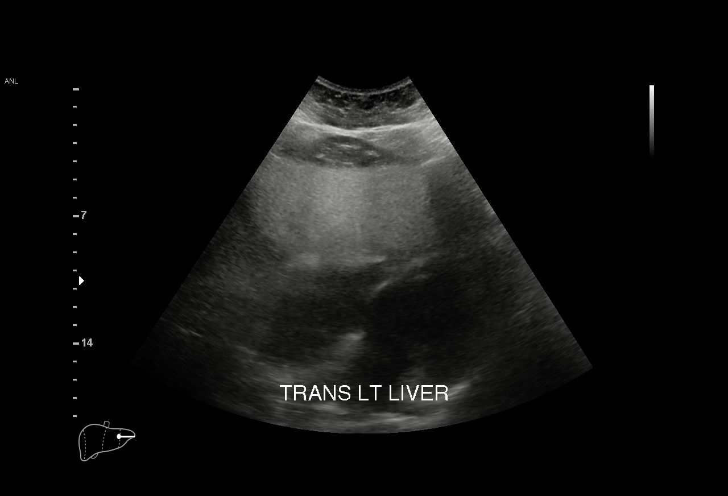
[im 80/88]
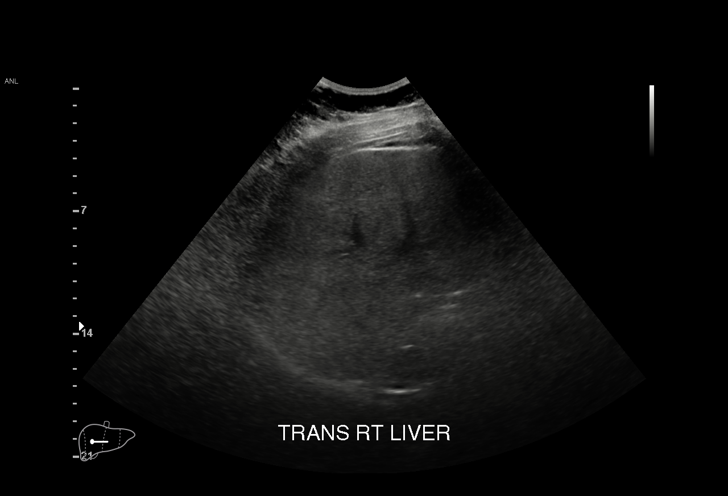
[im 88/88]
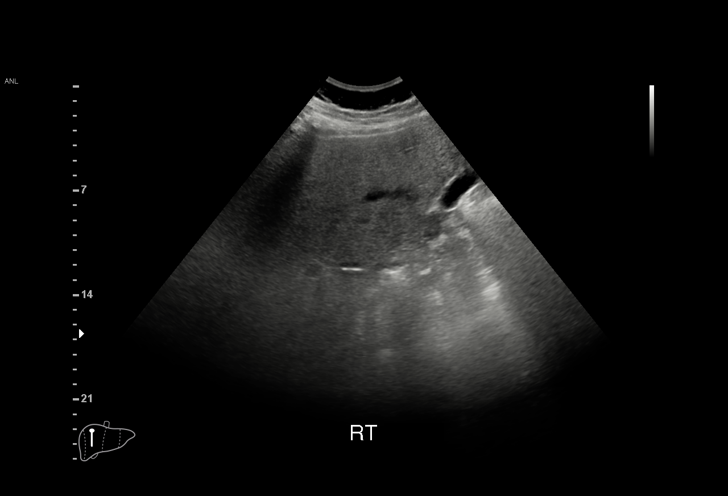

[15 of 25 positions shown; findings below may reference images not displayed]

FINDINGS: Gallbladder: Small polyp noted along the anterior wall measuring up
to 5 mm. No wall thickening or stones.

Common bile duct: Diameter: Normal caliber, 3 mm

Liver: Increased echotexture compatible with fatty infiltration. No
focal abnormality or biliary ductal dilatation.

IVC: No abnormality visualized.

Pancreas: Visualized portion unremarkable.

Spleen: Size and appearance within normal limits.

Right Kidney: Length: 13.4 cm. Echogenicity within normal limits. No
mass or hydronephrosis visualized.

Left Kidney: Length: 12.9 cm. Echogenicity within normal limits. No
mass or hydronephrosis visualized.

Abdominal aorta: No aneurysm visualized.

Other findings: None.
IMPRESSION: Fatty infiltration of the liver.

Small gallbladder wall polyp.

No acute findings.

## 2017-05-08 ENCOUNTER — Ambulatory Visit: Payer: 59 | Admitting: Dietician

## 2017-05-25 ENCOUNTER — Ambulatory Visit: Payer: 59 | Admitting: Dietician

## 2017-05-27 DIAGNOSIS — F4322 Adjustment disorder with anxiety: Secondary | ICD-10-CM | POA: Diagnosis not present

## 2017-05-28 ENCOUNTER — Encounter: Payer: 59 | Attending: Bariatrics | Admitting: Dietician

## 2017-05-28 ENCOUNTER — Encounter: Payer: Self-pay | Admitting: Dietician

## 2017-05-28 VITALS — Ht 61.5 in | Wt 237.6 lb

## 2017-05-28 DIAGNOSIS — Z6841 Body Mass Index (BMI) 40.0 and over, adult: Secondary | ICD-10-CM | POA: Diagnosis not present

## 2017-05-28 DIAGNOSIS — I10 Essential (primary) hypertension: Secondary | ICD-10-CM | POA: Insufficient documentation

## 2017-05-28 DIAGNOSIS — Z713 Dietary counseling and surveillance: Secondary | ICD-10-CM | POA: Diagnosis not present

## 2017-05-28 NOTE — Progress Notes (Signed)
Medical Nutrition Therapy: Visit start time: 1530  end time: 1630  Assessment:  Diagnosis: morbid obesity, HTN, sleep apnea Past medical history: fatty liver Psychosocial issues/ stress concerns: none Preferred learning method:  . Auditory . Visual . Hands-on  Current weight: 237.6lbs with shoes  Height: 5'1.5" Medications, supplements: reconciled list in medical record  Progress and evaluation: Patient reports weight has been stable recently. In the past, she has tried portion control and calorie counting along with exercise at gym for 11/2 years and lost only 20lbs. Has also tried slimfast, weight loss medications, without success. She has recently resumed efforts to control food portions, particularly starches, and make healthier food choices in general, as well as eating with appropriate frequency during the day. She is receiving support from family and friends.  Physical activity: walking on the job 3 times weekly for 12-hour shift; walking on days off playing Pokemon Go with family.  Dietary Intake:  Usual eating pattern includes 2-3 meals and 0-1 snacks per day. Dining out frequency: 8 meals per week.  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 Snack: beginning to snack on almonds Lunch: home: sandwich or tuna and crackers   At work: cafeteria-- meat and vegetables; or soup; or grilled chicken wrap with fruit; occasionally specialty menu item.  Snack: none Supper: often out (5x a week) fast food burger, [place], [place], etc. Patient states specific choices are not typically healthy. Snack: none Beverages: water, 1-2 glasses soda or sweet tea daily  Nutrition Care Education: Topics covered: bariatric diet, weight management Basic nutrition: appropriate meal and snack schedule, importance of low fat and low sugar foods, controlling portions of carbohydrate foods.    Bariatric Diet: overview of post-op diet  stages, including goals for protein and fluid intake, possible side effects and complications and importance of following diet closely to avoid them; importance of positive emotional support; recognizing weight loss surgery as a tool and not a permanent solution.   Nutritional Diagnosis:  Derma-3.3 Overweight/obesity As related to excess calories.  As evidenced by BMI 43.  Intervention: Instruction as noted above.   Patient seems to have a good understanding of the bariatric diet.    Patient is working on positive lifestyle changes.    Set goals for implementing strategies to ease adjustment to bariatric diet.      Education Materials given:  . Weight Loss Surgery Nutrition Guidelines (Bar. Spec.) . Goals/ instructions  Learner/ who was taught:  . Patient   Level of understanding: Marland Kitchen. Verbalizes/ demonstrates competency  Demonstrated degree of understanding via:   Teach back Learning barriers: . None  Willingness to learn/ readiness for change: . Eager, change in progress  Monitoring and Evaluation:  Dietary intake, exercise, and body weight      follow up: 06/24/17

## 2017-05-28 NOTE — Patient Instructions (Signed)
   Continue to work on eating every 4-5 hours, including small snack of protein such as 1/4 cup nuts, 1oz lowfat cheese, Greek yogurt (light).   Practice avoiding fluids 30 minutes before and 30 minutes after eating. Start by avoiding immediately before and during a meal, then allow for longer stretches of time gradually.   Keep up with regular physical activity on "off" days by walking.

## 2017-06-04 ENCOUNTER — Encounter: Payer: Self-pay | Admitting: *Deleted

## 2017-06-05 ENCOUNTER — Encounter: Payer: Self-pay | Admitting: Anesthesiology

## 2017-06-05 ENCOUNTER — Encounter
Admission: RE | Disposition: A | Discharge status: Home / Self Care | Payer: Self-pay | Source: Ambulatory Visit | Attending: Unknown Physician Specialty

## 2017-06-05 ENCOUNTER — Ambulatory Visit
Admission: RE | Admit: 2017-06-05 | Discharge: 2017-06-05 | Disposition: A | Discharge status: Home / Self Care | Payer: 59 | Source: Ambulatory Visit | Attending: Unknown Physician Specialty | Admitting: Unknown Physician Specialty

## 2017-06-05 ENCOUNTER — Ambulatory Visit: Payer: 59 | Admitting: Anesthesiology

## 2017-06-05 DIAGNOSIS — K319 Disease of stomach and duodenum, unspecified: Secondary | ICD-10-CM | POA: Diagnosis not present

## 2017-06-05 DIAGNOSIS — I10 Essential (primary) hypertension: Secondary | ICD-10-CM | POA: Diagnosis not present

## 2017-06-05 DIAGNOSIS — K2950 Unspecified chronic gastritis without bleeding: Secondary | ICD-10-CM | POA: Diagnosis not present

## 2017-06-05 DIAGNOSIS — K210 Gastro-esophageal reflux disease with esophagitis: Secondary | ICD-10-CM | POA: Diagnosis not present

## 2017-06-05 DIAGNOSIS — Z79899 Other long term (current) drug therapy: Secondary | ICD-10-CM | POA: Diagnosis not present

## 2017-06-05 DIAGNOSIS — Z01812 Encounter for preprocedural laboratory examination: Secondary | ICD-10-CM | POA: Diagnosis not present

## 2017-06-05 DIAGNOSIS — Z6841 Body Mass Index (BMI) 40.0 and over, adult: Secondary | ICD-10-CM | POA: Diagnosis not present

## 2017-06-05 DIAGNOSIS — Z01818 Encounter for other preprocedural examination: Secondary | ICD-10-CM | POA: Diagnosis not present

## 2017-06-05 DIAGNOSIS — K2960 Other gastritis without bleeding: Secondary | ICD-10-CM | POA: Diagnosis not present

## 2017-06-05 DIAGNOSIS — J45909 Unspecified asthma, uncomplicated: Secondary | ICD-10-CM | POA: Insufficient documentation

## 2017-06-05 DIAGNOSIS — K2900 Acute gastritis without bleeding: Secondary | ICD-10-CM | POA: Diagnosis not present

## 2017-06-05 HISTORY — PX: ESOPHAGOGASTRODUODENOSCOPY (EGD) WITH PROPOFOL: SHX5813

## 2017-06-05 SURGERY — ESOPHAGOGASTRODUODENOSCOPY (EGD) WITH PROPOFOL
Anesthesia: General

## 2017-06-05 MED ORDER — MIDAZOLAM HCL 2 MG/2ML IJ SOLN
INTRAMUSCULAR | Status: AC
  Filled 2017-06-05: qty 2

## 2017-06-05 MED ORDER — MIDAZOLAM HCL 2 MG/2ML IJ SOLN
INTRAMUSCULAR | Status: DC | PRN
  Administered 2017-06-05 (×2): 1 mg via INTRAVENOUS

## 2017-06-05 MED ORDER — PROPOFOL 10 MG/ML IV BOLUS
INTRAVENOUS | Status: DC | PRN
  Administered 2017-06-05: 50 mg via INTRAVENOUS
  Administered 2017-06-05: 100 mg via INTRAVENOUS
  Administered 2017-06-05: 20 mg via INTRAVENOUS
  Administered 2017-06-05: 50 mg via INTRAVENOUS
  Administered 2017-06-05: 40 mg via INTRAVENOUS
  Administered 2017-06-05: 50 mg via INTRAVENOUS

## 2017-06-05 MED ORDER — SODIUM CHLORIDE 0.9 % IV SOLN
INTRAVENOUS | Status: DC
  Administered 2017-06-05: 11:00:00 via INTRAVENOUS

## 2017-06-05 MED ORDER — SODIUM CHLORIDE 0.9 % IV SOLN: INTRAVENOUS | Status: DC

## 2017-06-05 MED ORDER — PROPOFOL 10 MG/ML IV BOLUS
INTRAVENOUS | Status: AC
  Filled 2017-06-05: qty 20

## 2017-06-05 NOTE — H&P (Signed)
Primary Care Physician:  Marguarite ArbourSparks, Jeffrey D, MD Primary Gastroenterologist:  Dr. Mechele CollinElliott  Pre-Procedure History & Physical: HPI:  Victoria Silva is a 36 y.o. female is here for an endoscopy.   Past Medical History:  Diagnosis Date  . Arthritis   . Asthma   . Degenerative disc disease, cervical   . Hypertension   . Migraine     Past Surgical History:  Procedure Laterality Date  . ABDOMINAL HYSTERECTOMY  2008   uterus removed only  . CESAREAN SECTION      Prior to Admission medications   Medication Sig Start Date End Date Taking? Authorizing Provider  bisoprolol-hydrochlorothiazide (ZIAC) 5-6.25 MG tablet Take 5-6.25 tablets by mouth daily. 01/06/17  Yes [provider]  gabapentin (NEURONTIN) 300 MG capsule Take 300 mg by mouth at bedtime. 01/23/17  Yes [provider]  magnesium oxide (MAG-OX) 400 MG tablet Take 400 mg by mouth daily.   Yes [provider]  SUMAtriptan (IMITREX) 100 MG tablet Take 100 mg by mouth daily as needed. 01/06/17  Yes [provider]  tiZANidine (ZANAFLEX) 4 MG tablet Take 4 mg by mouth every morning. 01/06/17  Yes [provider]  traMADol (ULTRAM) 50 MG tablet Take 50 mg by mouth 2 (two) times daily as needed. 01/06/17  Yes [provider]  triamterene-hydrochlorothiazide (DYAZIDE) 37.5-25 MG capsule triamterene 37.5 mg-hydrochlorothiazide 25 mg capsule   Yes [provider]  predniSONE (STERAPRED UNI-PAK 21 TAB) 10 MG (21) TBPK tablet Take 1 tablet (10 mg total) by mouth daily. As directed x 6 days Patient not taking: Reported on 06/05/2017 04/19/16   Bennie PieriniMartin, Mary-Margaret, FNP    Allergies as of 04/30/2017  . (No Known Allergies)    Family History  Problem Relation Age of Onset  . Diabetes Mellitus I Mother   . Hypertension Maternal Grandmother   . Hyperlipidemia Maternal Grandmother     Social History   Social History  . Marital status: Married    Spouse name: N/A  . Number  of children: N/A  . Years of education: N/A   Occupational History  . Not on file.   Social History Main Topics  . Smoking status: Never Smoker  . Smokeless tobacco: Never Used  . Alcohol use No  . Drug use: No  . Sexual activity: Not on file   Other Topics Concern  . Not on file   Social History Narrative  . No narrative on file    Review of Systems: See HPI, otherwise negative ROS  Physical Exam: BP (!) 112/55   Pulse 84   Temp (!) 96.8 F (36 C) (Tympanic)   Resp 18   Ht 5' 1.5" (1.562 m)   Wt 107.5 kg (237 lb)   SpO2 98%   BMI 44.06 kg/m  General:   Alert,  pleasant and cooperative in NAD Head:  Normocephalic and atraumatic. Neck:  Supple; no masses or thyromegaly. Lungs:  Clear throughout to auscultation.    Heart:  Regular rate and rhythm. Abdomen:  Soft, nontender and nondistended. Normal bowel sounds, without guarding, and without rebound.   Neurologic:  Alert and  oriented x4;  grossly normal neurologically.  Impression/Plan: Victoria Silva is here for an endoscopy to be performed for Pre op assessment for a planned gastric weight loss surgery due to obesity.  Risks, benefits, limitations, and alternatives regarding  endoscopy have been reviewed with the patient.  Questions have been answered.  All parties agreeable.   Cornell Gaber,  MD  06/05/2017, 11:20 AM

## 2017-06-05 NOTE — Transfer of Care (Signed)
Immediate Anesthesia Transfer of Care Note  Patient: Victoria Silva  Procedure(s) Performed: Procedure(s): ESOPHAGOGASTRODUODENOSCOPY (EGD) WITH PROPOFOL (N/A)  Patient Location: PACU and Endoscopy Unit  Anesthesia Type:General  Level of Consciousness: sedated and responds to stimulation  Airway & Oxygen Therapy: Patient Spontanous Breathing and Patient connected to nasal cannula oxygen  Post-op Assessment: Report given to RN and Post -op Vital signs reviewed and stable  Post vital signs: Reviewed and stable  Last Vitals:  Vitals:   06/05/17 1142 06/05/17 1143  BP: 104/70 104/77  Pulse: 75 70  Resp: 18 16  Temp: (!) 36.1 C     Last Pain:  Vitals:   06/05/17 1142  TempSrc: Tympanic         Complications: No apparent anesthesia complications

## 2017-06-05 NOTE — Anesthesia Post-op Follow-up Note (Cosign Needed)
Anesthesia QCDR form completed.        

## 2017-06-05 NOTE — Anesthesia Preprocedure Evaluation (Signed)
Anesthesia Evaluation  Patient identified by MRN, date of birth, ID band Patient awake    Reviewed: Allergy & Precautions, NPO status , Patient's Chart, lab work & pertinent test results, reviewed documented beta blocker date and time   Airway Mallampati: III  TM Distance: >3 FB     Dental  (+) Chipped   Pulmonary asthma ,           Cardiovascular hypertension, Pt. on medications and Pt. on home beta blockers      Neuro/Psych  Headaches,    GI/Hepatic   Endo/Other  Morbid obesity  Renal/GU      Musculoskeletal  (+) Arthritis ,   Abdominal   Peds  Hematology   Anesthesia Other Findings   Reproductive/Obstetrics                             Anesthesia Physical Anesthesia Plan  ASA: III  Anesthesia Plan: General   Post-op Pain Management:    Induction: Intravenous  PONV Risk Score and Plan:   Airway Management Planned:   Additional Equipment:   Intra-op Plan:   Post-operative Plan:   Informed Consent: I have reviewed the patients History and Physical, chart, labs and discussed the procedure including the risks, benefits and alternatives for the proposed anesthesia with the patient or authorized representative who has indicated his/her understanding and acceptance.     Plan Discussed with: CRNA  Anesthesia Plan Comments:         Anesthesia Quick Evaluation

## 2017-06-05 NOTE — Anesthesia Postprocedure Evaluation (Signed)
Anesthesia Post Note  Patient: Craig GuessMonique Danielle Sheaffer  Procedure(s) Performed: Procedure(s) (LRB): ESOPHAGOGASTRODUODENOSCOPY (EGD) WITH PROPOFOL (N/A)  Patient location during evaluation: Endoscopy Anesthesia Type: General Level of consciousness: awake and alert Pain management: pain level controlled Vital Signs Assessment: post-procedure vital signs reviewed and stable Respiratory status: spontaneous breathing, nonlabored ventilation, respiratory function stable and patient connected to nasal cannula oxygen Cardiovascular status: blood pressure returned to baseline and stable Postop Assessment: no signs of nausea or vomiting Anesthetic complications: no     Last Vitals:  Vitals:   06/05/17 1202 06/05/17 1212  BP: 120/86 130/86  Pulse: 73   Resp:    Temp:      Last Pain:  Vitals:   06/05/17 1142  TempSrc: Tympanic                 [name] S

## 2017-06-05 NOTE — Op Note (Signed)
[facility] Gastroenterology Patient Name: Victoria Silva Procedure Date: 06/05/2017 11:21 AM MRN: [id number] Account #: [id number] Date of Birth: Aug 03, 1981 Admit Type: Outpatient Age: 36 Room: [facility] ENDO ROOM 3 Gender: Female Note Status: Finalized Procedure:            Upper GI endoscopy Indications:          Preoperative assessment for bariatric surgery to treat                        morbid obesity Providers:            Scot Jun, MD Referring MD:         Duane Lope. Judithann Sheen, MD (Referring MD) Medicines:            Propofol per Anesthesia Complications:        No immediate complications. Procedure:            Pre-Anesthesia Assessment:                       - After reviewing the risks and benefits, the patient                        was deemed in satisfactory condition to undergo the                        procedure.                       After obtaining informed consent, the endoscope was                        passed under direct vision. Throughout the procedure,                        the patient's blood pressure, pulse, and oxygen                        saturations were monitored continuously. The Endoscope                        was introduced through the mouth, and advanced to the                        second part of duodenum. The upper GI endoscopy was                        accomplished without difficulty. The patient tolerated                        the procedure well. Findings:      LA Grade A (one or more mucosal breaks less than 5 mm, not extending       between tops of 2 mucosal folds) esophagitis with no bleeding was found       39 cm from the incisors.      Multiple dispersed, small non-bleeding erosions were found in the       gastric antrum and in the prepyloric region of the stomach. There were       no stigmata of recent bleeding. Biopsies were taken with a cold forceps       for histology. Biopsies were taken  with a cold forceps for  Helicobacter       pylori testing.      Diffuse, scattered and patchy moderate inflammation characterized by       erythema and granularity was found in the gastric antrum. Biopsy done of       body of stomach which appeared normal.      Duodenum appeared normal but biopsies done of second portion. Impression:           - LA Grade A reflux esophagitis.                       - Non-bleeding erosive gastropathy. Biopsied.                       - Gastritis. Recommendation:       - Await pathology results. Recommend take PPI bid for                        8-12 weeks and repeat endoscopy, await biopsies for                        possible H. pylori. Scot Junobert T Ahlivia Salahuddin, MD 06/05/2017 11:47:20 AM This report has been signed electronically. Number of Addenda: 0 Note Initiated On: 06/05/2017 11:21 AM      [facility]

## 2017-06-09 ENCOUNTER — Encounter: Payer: Self-pay | Admitting: Unknown Physician Specialty

## 2017-06-09 LAB — SURGICAL PATHOLOGY

## 2017-06-24 ENCOUNTER — Telehealth: Payer: Self-pay | Admitting: Dietician

## 2017-06-24 ENCOUNTER — Encounter: Payer: 59 | Attending: Bariatrics | Admitting: Dietician

## 2017-06-24 VITALS — Wt 235.6 lb

## 2017-06-24 DIAGNOSIS — Z6841 Body Mass Index (BMI) 40.0 and over, adult: Secondary | ICD-10-CM | POA: Insufficient documentation

## 2017-06-24 DIAGNOSIS — I10 Essential (primary) hypertension: Secondary | ICD-10-CM | POA: Diagnosis not present

## 2017-06-24 DIAGNOSIS — Z713 Dietary counseling and surveillance: Secondary | ICD-10-CM | POA: Diagnosis not present

## 2017-06-24 NOTE — Patient Instructions (Addendum)
-   Continue to increase the amount of physical activity you do each week, working up to 30-60 minutes a day, at least 5 days per week.  - Continue to increase the amount of protein-rich meal and snack choices you consume each day. Aiming for 60-80g/day.  - Look for good dietary sources of Iron, Folate, Vitamin B12, Calcium, as you may be at risk for becoming deficient in these nutrients post-op.

## 2017-06-24 NOTE — Progress Notes (Signed)
Appt start time: 1330 end time:  1415.  Assessment:  2nd SWL Appointment.   Start Wt at NDES: 237.6# Wt: 235.6# BMI: 44.1  Preferred Learning Style:  Auditory  Visual  Hands on  Learning Readiness:  Change in progress  MEDICATIONS: unchanged, see chart  DIETARY INTAKE: Patient reports increased effort to eat smaller meals as well as to eat more frequently. Opts of out items such as french fries or dinner rolls at work cafeteria and instead chooses items such as a chicken breast. She and her husband have been eating less dinners out and instead prepare meals at home in larger portions in order to have leftovers for several days. Beverages: water, low sugar/ carb protein drinks, tea 1-2x/wk. Has cut out consumption of sodas.  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 For example, avoiding consumption of fluids 30 min pre and post meal times, consuming smaller portions, and eating on a more consistent schedule. Has been experimenting with different broths, protein sources and nutritional drinks (Boost clear, Premier Protein, Gatorade etc.) to find which brands suite her preferences. She and her husband have been starting to eat out less often and instead will prepare meals at home in larger quantities to have leftovers. States husband also had the idea to share meals when eating out rather than ordering two entrees. Reviewed dietary sources of iron, Vit B12, folate, calcium as well as pre-op goals for bariatric surgery. Low-fat eating / cooking was reviewed. Will no longer be seeing Dr. Alva Garnetyner and instead will be working through [facility] to have the surgery.  Nutritional Diagnosis:  [place]-3.3 Overweight/obesity related to past poor dietary habits and physical inactivity as evidenced by  BMI and patient w/ future baratric surgery, following dietary guidelines for continued weight loss.               Intervention:  Instruction as noted above. Nutrition counseling for upcoming Bariatric Surgery provided. Patient continues to demonstrate proper understanding of diet for bariatric surgery and implementation of positive lifestyle changes. New goals were established to work on until next visit (07/22/17).  Teaching Method Utilized: Visual Auditory  Handouts given during visit include:  Pre-Op Goals for Bariatric Surgery  AVS with goals/ instructions  Barriers to learning/adherence to lifestyle change: none  Demonstrated degree of understanding via:  Teach Back   Monitoring/Evaluation:  Dietary intake, exercise, and body weight, follow up in 4 week(s).

## 2017-07-22 ENCOUNTER — Encounter: Payer: 59 | Attending: Bariatrics | Admitting: Dietician

## 2017-07-22 ENCOUNTER — Encounter: Payer: Self-pay | Admitting: Dietician

## 2017-07-22 VITALS — Wt 239.6 lb

## 2017-07-22 DIAGNOSIS — Z6841 Body Mass Index (BMI) 40.0 and over, adult: Secondary | ICD-10-CM | POA: Diagnosis not present

## 2017-07-22 DIAGNOSIS — Z713 Dietary counseling and surveillance: Secondary | ICD-10-CM | POA: Diagnosis not present

## 2017-07-22 DIAGNOSIS — I10 Essential (primary) hypertension: Secondary | ICD-10-CM | POA: Insufficient documentation

## 2017-07-22 NOTE — Progress Notes (Signed)
Appt start time: 1330 end time:  1400.  Assessment:   3rd SWL Appointment.   Start Wt at NDES: 237.6lb Wt: 239.6lb BMI: 45.2  Preferred Learning Style:  Auditory  Visual  Hands on  Learning Readiness:  Change in progress  MEDICATIONS: unchanged, see chart  DIETARY INTAKE:  Patient reports to continue to work on pre-op goals with the idea that they are to be approached as a lifestyle change. States she feels that she has a solid understanding of the pre-op goals and reasoning behind them. Has essentially cut out fast food and soda consumption. May go out to eat at a restaurant with her husband 1x/wk. Does not drink caffeine with the exception of the occasional hot tea. She has been experimenting with new recipes at home, mostly low-carb and high protein-based and has been paying more attention to portion sizes at each meal. Fluid intake has increased, stating she brings several water bottles with her to work each day. Reports to be experimenting with different brands and flavors of protein shakes to utilize post-op. Surgery date/ type not set at this time.  Usual physical activity: Walking with husband 3x/wk and also walks at work.  Diet to Follow: Will continue to follow a diet high in protein and fluids and low in fat. Minimum of 64oz of fluids/day and minimum of 60g protein/day.              Nutritional Diagnosis:  [place]-3.3 Overweight/obesity related to past poor dietary habits and physical inactivity as evidenced by BMI and patient w/ future bariatric surgery following dietary guidelines for continued weight loss.              Intervention:  Nutrition counseling for upcoming Bariatric Surgery. Patient continues to demonstrate proper understanding of diet for bariatric surgery and implementation of positive lifestyle changes. She will continue to work on pre-op goals until time of surgery.  Teaching Method Utilized: Visual Auditory  Handouts given during visit  include:  Pre-Op Goals for Bariatric Surgery  Barriers to learning/adherence to lifestyle change: none  Demonstrated degree of understanding via:  Teach Back   Monitoring/Evaluation:  Dietary intake, exercise, and body weight prn.

## 2017-07-30 DIAGNOSIS — R61 Generalized hyperhidrosis: Secondary | ICD-10-CM | POA: Diagnosis not present

## 2017-08-03 ENCOUNTER — Telehealth: Payer: 59 | Admitting: Family

## 2017-08-03 DIAGNOSIS — J020 Streptococcal pharyngitis: Secondary | ICD-10-CM | POA: Diagnosis not present

## 2017-08-03 MED ORDER — AMOXICILLIN 875 MG PO TABS: 875.0000 mg | ORAL_TABLET | Freq: Two times a day (BID) | ORAL | 0 refills | Status: DC

## 2017-08-03 NOTE — Progress Notes (Signed)
We are sorry that you are not feeling well.  Here is how we plan to help!  Based on what you have shared with me it looks like you have strep throat. I sent in amoxicillin twice a day for 7 days. Please get a new toothbrush in 3 days and do not share food or drink with others.  Home Care:  Only take medications as instructed by your medical team.  Complete the entire course of an antibiotic.  Do not take these medications with alcohol.  A steam or ultrasonic humidifier can help congestion.  You can place a towel over your head and breathe in the steam from hot water coming from a faucet.  Avoid close contacts especially the very young and the elderly.  Cover your mouth when you cough or sneeze.  Always remember to wash your hands.  Get Help Right Away If:  You develop worsening fever or sinus pain.  You develop a severe head ache or visual changes.  Your symptoms persist after you have completed your treatment plan.  Make sure you  Understand these instructions.  Will watch your condition.  Will get help right away if you are not doing well or get worse.  Your e-visit answers were reviewed by a board certified advanced clinical practitioner to complete your personal care plan.  Depending on the condition, your plan could have included both over the counter or prescription medications.  If there is a problem please reply  once you have received a response from your provider.  Your safety is important to us.  If you have drug allergies check your prescription carefully.    You can use MyChart to ask questions about today's visit, request a non-urgent call back, or ask for a work or school excuse for 24 hours related to this e-Visit. If it has been greater than 24 hours you will need to follow up with your provider, or enter a new e-Visit to address those concerns.  You will get an e-mail in the next two days asking about your experience.  I hope that your e-visit has been  valuable and will speed your recovery. Thank you for using e-visits.

## 2017-08-07 DIAGNOSIS — J029 Acute pharyngitis, unspecified: Secondary | ICD-10-CM | POA: Diagnosis not present

## 2017-08-07 DIAGNOSIS — J40 Bronchitis, not specified as acute or chronic: Secondary | ICD-10-CM | POA: Diagnosis not present

## 2017-08-17 DIAGNOSIS — M62838 Other muscle spasm: Secondary | ICD-10-CM | POA: Diagnosis not present

## 2017-08-17 DIAGNOSIS — M5030 Other cervical disc degeneration, unspecified cervical region: Secondary | ICD-10-CM | POA: Diagnosis not present

## 2017-08-17 DIAGNOSIS — M5412 Radiculopathy, cervical region: Secondary | ICD-10-CM | POA: Diagnosis not present

## 2017-11-13 NOTE — Telephone Encounter (Signed)
Date of patient care

## 2017-12-17 DIAGNOSIS — J019 Acute sinusitis, unspecified: Secondary | ICD-10-CM | POA: Diagnosis not present

## 2017-12-17 DIAGNOSIS — J40 Bronchitis, not specified as acute or chronic: Secondary | ICD-10-CM | POA: Diagnosis not present

## 2018-01-05 DIAGNOSIS — Z0000 Encounter for general adult medical examination without abnormal findings: Secondary | ICD-10-CM | POA: Diagnosis not present

## 2018-01-05 DIAGNOSIS — E282 Polycystic ovarian syndrome: Secondary | ICD-10-CM | POA: Diagnosis not present

## 2018-01-05 DIAGNOSIS — I10 Essential (primary) hypertension: Secondary | ICD-10-CM | POA: Diagnosis not present

## 2018-01-06 ENCOUNTER — Other Ambulatory Visit
Admission: RE | Admit: 2018-01-06 | Discharge: 2018-01-06 | Disposition: A | Discharge status: Home / Self Care | Payer: 59 | Source: Ambulatory Visit | Attending: Internal Medicine | Admitting: Internal Medicine

## 2018-01-06 DIAGNOSIS — Z01818 Encounter for other preprocedural examination: Secondary | ICD-10-CM | POA: Diagnosis not present

## 2018-01-06 LAB — CBC WITH DIFFERENTIAL/PLATELET
BASOS ABS: 0.1 10*3/uL (ref 0–0.1)
Basophils Relative: 1 %
EOS ABS: 0.5 10*3/uL (ref 0–0.7)
EOS PCT: 5 %
HCT: 40.5 % (ref 35.0–47.0)
Hemoglobin: 13.8 g/dL (ref 12.0–16.0)
Lymphocytes Relative: 31 %
Lymphs Abs: 3 10*3/uL (ref 1.0–3.6)
MCH: 29.8 pg (ref 26.0–34.0)
MCHC: 34.2 g/dL (ref 32.0–36.0)
MCV: 87 fL (ref 80.0–100.0)
MONO ABS: 0.5 10*3/uL (ref 0.2–0.9)
Monocytes Relative: 5 %
Neutro Abs: 5.7 10*3/uL (ref 1.4–6.5)
Neutrophils Relative %: 58 %
PLATELETS: 278 10*3/uL (ref 150–440)
RBC: 4.65 MIL/uL (ref 3.80–5.20)
RDW: 13.1 % (ref 11.5–14.5)
WBC: 9.7 10*3/uL (ref 3.6–11.0)

## 2018-01-06 LAB — COMPREHENSIVE METABOLIC PANEL
ALK PHOS: 67 U/L (ref 38–126)
ALT: 54 U/L (ref 14–54)
AST: 56 U/L — AB (ref 15–41)
Albumin: 4 g/dL (ref 3.5–5.0)
Anion gap: 12 (ref 5–15)
BILIRUBIN TOTAL: 1.1 mg/dL (ref 0.3–1.2)
BUN: 16 mg/dL (ref 6–20)
CALCIUM: 9.3 mg/dL (ref 8.9–10.3)
CO2: 23 mmol/L (ref 22–32)
CREATININE: 0.58 mg/dL (ref 0.44–1.00)
Chloride: 101 mmol/L (ref 101–111)
GFR calc Af Amer: >60 mL/min (ref >60)
GFR calc non Af Amer: >60 mL/min (ref >60)
GLUCOSE: 107 mg/dL — AB (ref 65–99)
POTASSIUM: 3.6 mmol/L (ref 3.5–5.1)
Sodium: 136 mmol/L (ref 135–145)
TOTAL PROTEIN: 7.6 g/dL (ref 6.5–8.1)

## 2018-01-06 LAB — HEMOGLOBIN A1C
HEMOGLOBIN A1C: 6 % — AB (ref 4.8–5.6)
Mean Plasma Glucose: 125.5 mg/dL

## 2018-01-06 LAB — URINALYSIS, COMPLETE (UACMP) WITH MICROSCOPIC
BILIRUBIN URINE: NEGATIVE
Bacteria, UA: NONE SEEN
GLUCOSE, UA: NEGATIVE mg/dL
Hgb urine dipstick: NEGATIVE
KETONES UR: NEGATIVE mg/dL
LEUKOCYTES UA: NEGATIVE
NITRITE: NEGATIVE
PH: 6 (ref 5.0–8.0)
Protein, ur: NEGATIVE mg/dL
SPECIFIC GRAVITY, URINE: 1.019 (ref 1.005–1.030)

## 2018-01-06 LAB — LIPID PANEL
CHOL/HDL RATIO: 5 ratio
CHOLESTEROL: 165 mg/dL (ref 0–200)
HDL: 33 mg/dL — AB (ref >40)
LDL Cholesterol: 113 mg/dL — ABNORMAL HIGH (ref 0–99)
Triglycerides: 96 mg/dL (ref <150)
VLDL: 19 mg/dL (ref 0–40)

## 2018-01-06 LAB — CORTISOL: CORTISOL PLASMA: 3.2 ug/dL

## 2018-01-06 LAB — VITAMIN B12: Vitamin B-12: 275 pg/mL (ref 180–914)

## 2018-01-07 LAB — VITAMIN D 25 HYDROXY (VIT D DEFICIENCY, FRACTURES): VIT D 25 HYDROXY: 20.9 ng/mL — AB (ref 30.0–100.0)

## 2018-01-07 LAB — THYROID PANEL
Free Thyroxine Index: 4 (ref 1.2–4.9)
T3 UPTAKE RATIO: 32 % (ref 24–39)
T4, Total: 12.5 ug/dL — ABNORMAL HIGH (ref 4.5–12.0)

## 2018-02-03 DIAGNOSIS — B078 Other viral warts: Secondary | ICD-10-CM | POA: Diagnosis not present

## 2018-02-03 DIAGNOSIS — R61 Generalized hyperhidrosis: Secondary | ICD-10-CM | POA: Diagnosis not present

## 2018-02-09 DIAGNOSIS — K210 Gastro-esophageal reflux disease with esophagitis: Secondary | ICD-10-CM | POA: Diagnosis not present

## 2018-02-09 DIAGNOSIS — R7303 Prediabetes: Secondary | ICD-10-CM | POA: Diagnosis not present

## 2018-02-09 DIAGNOSIS — G4733 Obstructive sleep apnea (adult) (pediatric): Secondary | ICD-10-CM | POA: Diagnosis not present

## 2018-02-09 DIAGNOSIS — I10 Essential (primary) hypertension: Secondary | ICD-10-CM | POA: Diagnosis not present

## 2018-02-09 DIAGNOSIS — G43809 Other migraine, not intractable, without status migrainosus: Secondary | ICD-10-CM | POA: Diagnosis not present

## 2018-02-09 DIAGNOSIS — E7800 Pure hypercholesterolemia, unspecified: Secondary | ICD-10-CM | POA: Diagnosis not present

## 2018-02-09 DIAGNOSIS — K760 Fatty (change of) liver, not elsewhere classified: Secondary | ICD-10-CM | POA: Diagnosis not present

## 2018-02-09 DIAGNOSIS — E559 Vitamin D deficiency, unspecified: Secondary | ICD-10-CM | POA: Diagnosis not present

## 2018-02-26 DIAGNOSIS — M5030 Other cervical disc degeneration, unspecified cervical region: Secondary | ICD-10-CM | POA: Diagnosis not present

## 2018-02-26 DIAGNOSIS — M5412 Radiculopathy, cervical region: Secondary | ICD-10-CM | POA: Diagnosis not present

## 2018-02-26 DIAGNOSIS — M62838 Other muscle spasm: Secondary | ICD-10-CM | POA: Diagnosis not present

## 2018-05-02 ENCOUNTER — Telehealth: Payer: 59 | Admitting: Physician Assistant

## 2018-05-02 DIAGNOSIS — J020 Streptococcal pharyngitis: Secondary | ICD-10-CM

## 2018-05-02 MED ORDER — AMOXICILLIN-POT CLAVULANATE 875-125 MG PO TABS: 1.0000 | ORAL_TABLET | Freq: Two times a day (BID) | ORAL | 0 refills | Status: DC

## 2018-05-02 NOTE — Progress Notes (Signed)
We are sorry that you are not feeling well.  Here is how we plan to help!  Based on what you have shared with me it is likely that you have strep pharyngitis.  Strep pharyngitis is inflammation and infection in the back of the throat.  This is an infection cause by bacteria and is treated with antibiotics.  I have prescribed Augmentin 875mg /125mg  one tablet twice daily with food, for 7 days..  For throat pain, we recommend over the counter oral pain relief medications such as acetaminophen or aspirin, or anti-inflammatory medications such as ibuprofen or naproxen sodium. Topical treatments such as oral throat lozenges or sprays may be used as needed. Strep infections are not as easily transmitted as other respiratory infections, however we still recommend that you avoid close contact with loved ones, especially the very young and elderly.  Remember to wash your hands thoroughly throughout the day as this is the number one way to prevent the spread of infection and wipe down door knobs and counters with disinfectant. Use delsym over the counter to help with cough.   Home Care:  Only take medications as instructed by your medical team.  Complete the entire course of an antibiotic.  Do not take these medications with alcohol.  A steam or ultrasonic humidifier can help congestion.  You can place a towel over your head and breathe in the steam from hot water coming from a faucet.  Avoid close contacts especially the very young and the elderly.  Cover your mouth when you cough or sneeze.  Always remember to wash your hands.  Get Help Right Away If:  You develop worsening fever or sinus pain.  You develop a severe head ache or visual changes.  Your symptoms persist after you have completed your treatment plan.  Make sure you  Understand these instructions.  Will watch your condition.  Will get help right away if you are not doing well or get worse.  Your e-visit answers were reviewed by  a board certified advanced clinical practitioner to complete your personal care plan.  Depending on the condition, your plan could have included both over the counter or prescription medications.  If there is a problem please reply  once you have received a response from your provider.  Your safety is important to us.  If you have drug allergies check your prescription carefully.    You can use MyChart to ask questions about today's visit, request a non-urgent call back, or ask for a work or school excuse for 24 hours related to this e-Visit. If it has been greater than 24 hours you will need to follow up with your provider, or enter a new e-Visit to address those concerns.  You will get an e-mail in the next two days asking about your experience.  I hope that your e-visit has been valuable and will speed your recovery. Thank you for using e-visits.

## 2018-06-29 DIAGNOSIS — N941 Unspecified dyspareunia: Secondary | ICD-10-CM | POA: Diagnosis not present

## 2018-06-29 DIAGNOSIS — N898 Other specified noninflammatory disorders of vagina: Secondary | ICD-10-CM | POA: Diagnosis not present

## 2018-06-29 DIAGNOSIS — Z01419 Encounter for gynecological examination (general) (routine) without abnormal findings: Secondary | ICD-10-CM | POA: Diagnosis not present

## 2018-07-23 ENCOUNTER — Other Ambulatory Visit: Payer: Self-pay | Admitting: Internal Medicine

## 2018-07-23 DIAGNOSIS — Z1329 Encounter for screening for other suspected endocrine disorder: Secondary | ICD-10-CM | POA: Diagnosis not present

## 2018-07-23 DIAGNOSIS — R8290 Unspecified abnormal findings in urine: Secondary | ICD-10-CM | POA: Diagnosis not present

## 2018-07-23 DIAGNOSIS — I10 Essential (primary) hypertension: Secondary | ICD-10-CM | POA: Diagnosis not present

## 2018-07-23 DIAGNOSIS — R109 Unspecified abdominal pain: Secondary | ICD-10-CM | POA: Diagnosis not present

## 2018-07-23 DIAGNOSIS — E282 Polycystic ovarian syndrome: Secondary | ICD-10-CM | POA: Diagnosis not present

## 2018-09-03 ENCOUNTER — Encounter: Payer: 59 | Attending: General Surgery | Admitting: Dietician

## 2018-09-03 ENCOUNTER — Encounter: Payer: Self-pay | Admitting: Dietician

## 2018-09-03 VITALS — Ht 61.0 in | Wt 235.3 lb

## 2018-09-03 DIAGNOSIS — Z6841 Body Mass Index (BMI) 40.0 and over, adult: Secondary | ICD-10-CM

## 2018-09-03 NOTE — Progress Notes (Signed)
Nutrition Assessment  Proposed Surgery: Gastric Bypass  MD: Gaynelle Adu RD: Connye Burkitt  Height: 5'1" Weight: 235.3lbs BMI: 44.46 Upper IBW% (UIBW): 203% (116lbs)  Patient's Goal Weight: 150lbs  Medical History: PCOS, HTN, GERD, sleep apnea, fatty liver, prediabetes Medications and Supplements: bisoprolol-hydrochlorothiazide, gabapentin, glycopyrrolate, magnesium oxide, pantoprazole, SUMAtriptan, tiZANidine, traMADol, triamterene-hydrochlorothiazide  Previous surgeries: C-section, partial hysterectomy Drug allergies: none known Food allergies: none known Alcohol use: none  Tobacco use: none  Physical activity: significant walking at work; walking on days off 45-60 minutes 3 times a week  Weight history: Childhood: high normal, or slightly overweight    Adolescence: normal weight until first pregnancy at age 25    Adulthood: weight gain after each pregnancy and when developed PCOS normal        Weight 1 year ago: 239lbs  Dieting/ weight loss history: Patient has tried portion control and calorie counting along with exercise at gym for 11/2 years and lost only 20lbs. Has also tried slimfast, weight loss medications, without any significant success.  Patient has maintained some healthier eating habits since RD visits during 2018, and has maintained/ reduced weight according to today's measurements.   Dietary Recall:  Daily pattern: 2-3 meals and 0 snacks. Dining out: 4-5 meals per week. Breakfast: often skips -- can eat only after being awake for 2 hours occasionally 2 eggs 2 slices bacon and 1 slice toast; sometimes just fruit Lunch: salad; hibachi food without rice Supper: fast food when at work ie grilled chicken sandwich, no fries; sometimes not hungry. When home-- grilled foods Snack(s): none, not an habitual snacker.  Beverages: water,sweet tea or coffee-- once a day  Psychosocial: Emotional eating history: none  Disordered eating history: none   Intervention:  Patient has  researched this procedure by attendance at weight management/ bariatric classes, consulting with friends and coworkers, and reading.   Patient has worked to limit amounts of starchy foods and sweets.   Instructed her on pre-op diet guidelines, including liver reduction diet.   Discussed stages of the bariatric diet after surgery as well as the importance of adequate protein and fluid intake, and vitamin supplementation.   Summary:  Patient has made diet and lifestyle changes in effort to lose weight and prepare for bariatric surgery.  Patient voices understanding of necessary diet and lifestyle changes both pre- and post-op and is motivated to comply.  She has solid support from family, friends, and coworkers.   She agrees to work on limiting fluids during meals and continued work on reducing carbohydrate intake prior to surgery.   She is ready and motivated to follow the bariatric diet after surgery. From a nutrition standpoint, she is ready to proceed with the bariatric surgery program.    Plan:  Patient commits to returning for  Pre-op class prior to surgery.   She will plan to return for post-op RD visits beginning 2 weeks after surgery.

## 2018-09-03 NOTE — Patient Instructions (Addendum)
   Gradually reduce the amount of fluids during meals; work on taking small sips only if needed to wash down dry foods.   Keep up lean proteins and include some low-carb veggies to prepare for pre-op diet.   Continue to walk regularly, great job!

## 2018-09-17 ENCOUNTER — Ambulatory Visit: Payer: 59

## 2018-09-20 ENCOUNTER — Telehealth: Payer: Self-pay | Admitting: Dietician

## 2018-09-20 NOTE — Telephone Encounter (Signed)
Called patient to reschedule her pre-op class, which she missed on 09/17/18. Left a voicemail message with options for rescheduling, and requested a call back.

## 2018-09-22 ENCOUNTER — Telehealth: Payer: Self-pay | Admitting: Dietician

## 2018-09-22 NOTE — Telephone Encounter (Signed)
Patient returned call to reschedule her pre-op class. She has been ill, and has had a family member hospitalized over the past week. She rescheduled for 10/15/18. Discussed pre-op diet as she will need to start prior to the class.

## 2018-10-01 DIAGNOSIS — M62838 Other muscle spasm: Secondary | ICD-10-CM | POA: Diagnosis not present

## 2018-10-01 DIAGNOSIS — M5030 Other cervical disc degeneration, unspecified cervical region: Secondary | ICD-10-CM | POA: Diagnosis not present

## 2018-10-01 DIAGNOSIS — M5412 Radiculopathy, cervical region: Secondary | ICD-10-CM | POA: Diagnosis not present

## 2018-10-14 DIAGNOSIS — R21 Rash and other nonspecific skin eruption: Secondary | ICD-10-CM

## 2018-10-14 HISTORY — DX: Rash and other nonspecific skin eruption: R21

## 2018-10-14 NOTE — Progress Notes (Signed)
Please place orders in Epic as patient has a Pre-op appointment on 10/21/2018! Thank you! 

## 2018-10-15 ENCOUNTER — Ambulatory Visit: Payer: Self-pay | Admitting: General Surgery

## 2018-10-15 ENCOUNTER — Encounter: Payer: 59 | Attending: General Surgery

## 2018-10-15 DIAGNOSIS — E559 Vitamin D deficiency, unspecified: Secondary | ICD-10-CM | POA: Diagnosis not present

## 2018-10-15 DIAGNOSIS — G4733 Obstructive sleep apnea (adult) (pediatric): Secondary | ICD-10-CM | POA: Diagnosis not present

## 2018-10-15 DIAGNOSIS — Z6841 Body Mass Index (BMI) 40.0 and over, adult: Secondary | ICD-10-CM

## 2018-10-15 DIAGNOSIS — K760 Fatty (change of) liver, not elsewhere classified: Secondary | ICD-10-CM | POA: Diagnosis not present

## 2018-10-15 DIAGNOSIS — K210 Gastro-esophageal reflux disease with esophagitis: Secondary | ICD-10-CM | POA: Diagnosis not present

## 2018-10-15 DIAGNOSIS — K8050 Calculus of bile duct without cholangitis or cholecystitis without obstruction: Secondary | ICD-10-CM | POA: Diagnosis not present

## 2018-10-15 DIAGNOSIS — R7303 Prediabetes: Secondary | ICD-10-CM | POA: Diagnosis not present

## 2018-10-15 DIAGNOSIS — E7800 Pure hypercholesterolemia, unspecified: Secondary | ICD-10-CM | POA: Diagnosis not present

## 2018-10-15 NOTE — H&P (Signed)
Victoria Victoria Silva Documented: 10/15/2018 1:53 PM Location: [facility] Patient #: [id number] DOB: 29-Apr-1981 Married / Language: Victoria Victoria Silva / Race: White Female  History of Present Illness Victoria Victoria Silva; 10/15/2018 2:32 PM) The patient is a 37 year old female who presents for a bariatric surgery evaluation. She comes in today for preoperative appointment. She is a coming about her husband. She denies any medical changes since I initially met her in March. She denies any trips the emergency room or hospital. She is still having intermittent episodes of abdominal discomfort generally postprandial associated with nausea. The discomfort will last a few hours. She is inquiring if she can have her gallbladder removed during her gastric bypass. Otherwise she still has ongoing reflux issues. She denies any chest pain, chest pressure, source of breath, orthopnea, dyspnea on exertion, TIAs or amaruosis fugax. She did get prednisone 4 weeks ago for upper respiratory issue. She denies any productive cough or sputum.  01/2018 She is referred by Victoria Victoria Silva for evaluation of weight loss surgery. She completed our Neurosurgeon. She has thought about weight loss surgery for the past several years. She talked with another group about weight loss surgery. Her gastroenterologist Victoria. Victoria Victoria Silva. She is leaning between a sleeve gastrectomy and gastric bypass. She is interested in improving her overall health, resolving her hypertension and increasing her energy.  Despite numerous attempts for sustained weight loss she has been unsuccessful. She has tried counting calories and regular physical activity, phentermine, as well as supervised weight loss visits with the dietitian-all without any long-term success  Her comorbidities include hypertension, prediabetes, GERD, obstructive sleep apnea, migraines  She denies any chest pain, chest pressure, shortness of breath, orthopnea, paroxysmal nocturnal dyspnea,  dyspnea on exertion, TIAs or amaurosis fugax. She had a sleep study a year to ago at [facility]. She did not have the titration part of the study. She will have reflux generally at night but it is mostly controlled with reflux medication. She did have an upper endoscopy last year which showed LAD grade a esophagitis, small nonbleeding erosions in the gastric antrum, and negative H. pylori on biopsy. There is no evidence of metaplasia or Barrett's esophagus. She also reports intermittent episodes of colicky mainly upper abdominal pain. In the last 30-40 minutes associated with nausea. Mostly around the intake. No dysphagia. No hematemesis. She had an abdominal ultrasound showed fatty liver, a 5 mm gallbladder polyp. She denies any melena hematochezia. She has daily bowel movements. She's had a partial hysterectomy and 3 C-sections. She denies any dysuria or hematuria. She has chronic left lower back pain without sciatica. She also has neck pain. She is on gabapentin for this. She denies any TIAs or amaurosis fugax. She has migraines a couple times a week. She denies any tobacco or alcohol or drug use. 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 TiZANidine HCl (4MG Tablet, Oral) Active. TraMADol HCl (50MG Tablet, Oral) Active. Gabapentin (300MG Capsule, Oral) Active. Triamterene-HCTZ (37.5-25MG Capsule, Oral) Active. Bisoprolol Fumarate (5MG Tablet, Oral) Active. Magnesium Oxide (400MG Tablet, Oral) Active. Medications Reconciled  Social History (Victoria Victoria Silva, Victoria Silva; 10/15/2018 2:37 PM) Alcohol use Occasional alcohol use. Caffeine use Tea. No drug use Tobacco use Never smoker.  Family History (Victoria Victoria Silva, Victoria Silva; 10/15/2018 2:37 PM) Hypertension Mother. Respiratory Condition Mother.  Pregnancy / Birth History (Victoria Victoria Silva, Victoria Silva; 10/15/2018 2:37 PM) Age at menarche 12 years. Gravida 3 Maternal age 15-20 Para 3  Other Problems (Victoria Victoria Silva, Victoria Silva; 10/15/2018 2:37 PM) HYPERTENSION, ESSENTIAL (I10) HEADACHE, VARIANT MIGRAINE (G43.809)     Review of Systems (Victoria Victoria Silva; 10/15/2018 2:32 PM) General Present- Fatigue and Weight Gain. Not Present- Appetite Loss, Chills, Fever, Night Sweats and Weight Loss. Skin Not Present- Change in Wart/Mole, Dryness, Hives, Jaundice, New Lesions, Non-Healing Wounds, Rash and Ulcer. HEENT Not Present- Earache, Hearing Loss, Hoarseness, Nose Bleed, Oral Ulcers, Ringing in the Ears, Seasonal Allergies, Sinus Pain, Sore Throat, Visual Disturbances, Wears glasses/contact lenses and Yellow Eyes. Respiratory Present- Snoring. Not Present- Bloody sputum, Chronic Cough, Difficulty Breathing and Wheezing. Breast Not Present- Breast Mass, Breast Pain, Nipple Discharge and Skin  Changes. Cardiovascular Not Present- Chest Pain, Difficulty Breathing Lying Down, Leg Cramps, Palpitations, Rapid Heart Rate, Shortness of Breath and Swelling of Extremities. Gastrointestinal Not Present- Abdominal Pain, Bloating, Bloody Stool, Change in Bowel Habits, Chronic diarrhea, Constipation, Difficulty Swallowing, Excessive gas, Gets full quickly at meals, Hemorrhoids, Indigestion, Nausea, Rectal Pain and Vomiting. Female Genitourinary Not Present- Frequency, Nocturia, Painful Urination, Pelvic Pain and Urgency. Musculoskeletal Not Present- Back Pain, Joint Pain, Joint Stiffness, Muscle Pain, Muscle Weakness and Swelling of Extremities. Neurological Not Present- Decreased Memory, Fainting, Headaches, Numbness, Seizures, Tingling, Tremor, Trouble walking and Weakness. Psychiatric Not Present- Anxiety, Bipolar, Change in Sleep Pattern, Depression, Fearful and Frequent crying. Endocrine Not Present- Cold Intolerance, Excessive Hunger, Hair Changes, Heat Intolerance, Hot flashes and New Diabetes.  Vitals (Victoria Victoria Silva RMA; 10/15/2018 1:54 PM) 10/15/2018 1:53 PM Weight: 236 lb Height: 61in Body Surface Area: 2.03 m Body Mass Index: 44.59 kg/m  Pulse: 87 (Regular)  BP: 126/84 (Sitting, Left Arm, Standard)      Physical Exam (Anil Havard M. Dellar Traber Victoria Silva; 10/15/2018 2:33 PM)  General Mental Status-Alert. General Appearance-Consistent with stated age. Hydration-Well hydrated. Voice-Normal. Note: severe obesity; evenly distributed  Head and Neck Head-normocephalic, atraumatic with no lesions or palpable masses. Trachea-midline. Thyroid Gland Characteristics - normal size and consistency.  Eye Eyeball - Bilateral-Extraocular movements intact. Sclera/Conjunctiva - Bilateral-No scleral icterus.  ENMT Ears -Note:normal ext ears lips intact.   Chest and Lung Exam Chest and lung exam reveals -quiet, even and easy respiratory effort with no use of  accessory muscles and on auscultation, normal breath sounds, no adventitious sounds and normal vocal resonance. Inspection Chest Wall - Normal. Back - normal.  Breast - Did not examine.  Cardiovascular Cardiovascular examination reveals -normal heart sounds, regular rate and rhythm with no murmurs and normal pedal pulses bilaterally.  Abdomen Inspection Inspection of the   abdomen reveals - No Hernias. Skin - Scar - Note: old c/s scar. Palpation/Percussion Palpation and Percussion of the abdomen reveal - Soft, Non Tender, No Rebound tenderness, No Rigidity (guarding) and No hepatosplenomegaly. Auscultation Auscultation of the abdomen reveals - Bowel sounds normal.  Peripheral Vascular Upper Extremity Palpation - Pulses bilaterally normal.  Neurologic Neurologic evaluation reveals -alert and oriented x 3 with no impairment of recent or remote memory. Mental Status-Normal.  Neuropsychiatric The patient's mood and affect are described as -normal. Judgment and Insight-insight is appropriate concerning matters relevant to self.  Musculoskeletal Normal Exam - Left-Upper Extremity Strength Normal and Lower Extremity Strength Normal. Normal Exam - Right-Upper Extremity Strength Normal and Lower Extremity Strength Normal.  Lymphatic Head & Neck  General Head & Neck Lymphatics: Bilateral - Description - Normal. Axillary - Did not examine. Femoral & Inguinal - Did not examine.    Assessment & Plan (Ruthanna Macchia M. Rosalynn Sergent Victoria Silva; 10/15/2018 2:37 PM)  MORBID OBESITY (E66.01) Impression: The patient meets weight loss surgery criteria. I think the patient would be an acceptable candidate for Laparoscopic Roux-en-Y Gastric bypass.  We discussed her surgical evaluation. She had a prior upper endoscopy that showed esophagitis but no evidence of hiatal hernia. We reviewed the typical hospitalization as well as the typical recovery. We discussed the typical issues that we commonly see  after gastric bypass. We discussed the preoperative diet. All of her questions were asked and answered.  I did offer to go over gastric bypass risk/benefits with pt's husband but he declined  Current Plans Pt Education - EMW_preopbariatric  PREDIABETES (R73.03)   FATTY LIVER (K76.0)   ELEVATED LDL CHOLESTEROL LEVEL (E78.00)   GASTROESOPHAGEAL REFLUX DISEASE WITH ESOPHAGITIS (K21.0) Impression: With her history of reflux and nonbleeding erosions on prior upper endoscopy we discussed that in the long run a Roux-en-Y gastric bypass would probably be better than the sleeve gastrectomy. We discussed the possibility of increased reflux-heartburn with sleeve gastrectomy.   VITAMIN D DEFICIENCY (E55.9)   OSA (OBSTRUCTIVE SLEEP APNEA) (G47.33)   BILIARY COLIC (K80.50) Impression: She does relate ongoing episodes of upper abdominal-mid abdominal right flank intermittent pain that last a few hours associated with nausea. Her ultrasound showed no evidence of gallstones. We did discuss the possibility of biliary dyskinesia. We will check a nuclear medicine scan. If positive then we can proceed with cholecystectomy same day as gastric bypass. If nuclear medicine scan is negative then we will need to discuss that over the phone whether or not we proceed with same day cholecystectomy since the likelihood of amelioration of her symptoms would be hard to say based on negative imaging.  I discussed laparoscopic cholecystectomy with possible IOC in detail. The patient was given educational material as well as diagrams detailing the procedure. We discussed the risks and benefits of a laparoscopic cholecystectomy including, but not limited to bleeding, infection, injury to surrounding structures such as the intestine or liver, bile leak, retained gallstones, need to convert to an open procedure, prolonged diarrhea, blood clots such as DVT, common bile duct injury, anesthesia risks, and possible need for  additional procedures.  we did discuss that if she were to have bile leak or CBD injury during cholecystectomy at same time as gastric bypass then her perioperative risks would be significantly elevated.   Loxley Schmale M. Temisha Murley, Victoria Silva, FACS General, Bariatric, & Minimally Invasive Surgery [facility]   

## 2018-10-15 NOTE — Progress Notes (Signed)
Pre-Operative Nutrition Class:  Appt start time: 0900   End time: 1100.  Patient was seen on 10/15/18 for Pre-Operative Bariatric Surgery Education at [facility].   Surgery date: 10/26/18 Surgery type: Gastric bypass [facility] weight at [facility]: 235.3lbs Weight today: 232.2lbs  InBody  BODY COMP RESULTS    BMI (kg/m^2) 43.9  Fat Mass (lbs) 118.0  Fat Free Mass (lbs) 30.0  Total Body Water (lbs) 84.2   Samples given per MNT protocol. Patient educated on appropriate usage: Celebrate Vitamins Multivitamin Lot # [id number] Exp: 01/2019; Lot# [id number] Exp: 12/2018  Celebrate Vitamins MVI softchew Lot# [id number] Exp: 01/2020  Celebrate Vitamins Calcium Citrate Lot # [id number] Exp: 04/2019 Celebrate Vitamins Calcium softchew Lot# [id number] Exp: 09/2019  Celebrate Vitamins Iron 45 Lot# [id number]  Exp: 01/2020 Celebrate Vitamins Iron 60 softchew Lot# [id number] Exp: 01/2020  Celebrate Vitamins B12 Lot# [id number] Exp: 06/2019  Renee Pain Protein Powder Lot # [id number] Exp: 09/2019; Lot# [id number] Exp: 09/2019; Lot# [id number] Exp: 06/2019  Renee Pain Protein Shake Lot# [id number] Exp: 02/06/2019  Premier Clear Protein Drink  Lot# [id number] Exp: 02/17/2019  The following the learning objectives were met by the patient during this course:  Identify Pre-Op Dietary Goals and will begin 2 weeks pre-operatively  Identify appropriate sources of fluids and proteins   State protein recommendations and appropriate sources pre and post-operatively  Identify Post-Operative Dietary Goals and will follow for 2 weeks post-operatively  Identify appropriate multivitamin and calcium sources  Describe the need for physical activity post-operatively and will follow MD recommendations  State when to call healthcare provider regarding medication questions or post-operative complications  Handouts given during class include:  Pre-Op Bariatric Surgery Diet Handout  Protein Shake Handout  Post-Op Bariatric  Surgery Nutrition Handout  BELT Program Information Flyer  Support Group Information Flyer  [facility] Bariatric Supplements Price List  Follow-Up Plan: Patient will follow-up at [facility], at about 2 weeks post operatively for diet advancement per MD.

## 2018-10-15 NOTE — H&P (View-Only) (Signed)
Prince Solian Documented: 10/15/2018 1:53 PM Location: [place] Office Patient #: [id number] DOB: 1981/06/06 Married / Language: Cleophus Molt / Race: White Female  History of Present Illness Randall Hiss M. Edmonia Gonser MD; 10/15/2018 2:32 PM) The patient is a 37 year old female who presents for a bariatric surgery evaluation. She comes in today for preoperative appointment. She is a coming about her husband. She denies any medical changes since I initially met her in March. She denies any trips the emergency room or hospital. She is still having intermittent episodes of abdominal discomfort generally postprandial associated with nausea. The discomfort will last a few hours. She is inquiring if she can have her gallbladder removed during her gastric bypass. Otherwise she still has ongoing reflux issues. She denies any chest pain, chest pressure, source of breath, orthopnea, dyspnea on exertion, TIAs or amaruosis fugax. She did get prednisone 4 weeks ago for upper respiratory issue. She denies any productive cough or sputum.  01/2018 She is referred by Dr Doy Hutching for evaluation of weight loss surgery. She completed our Neurosurgeon. She has thought about weight loss surgery for the past several years. She talked with another group about weight loss surgery. Her gastroenterologist Dr. [name]. She is leaning between a sleeve gastrectomy and gastric bypass. She is interested in improving her overall health, resolving her hypertension and increasing her energy.  Despite numerous attempts for sustained weight loss she has been unsuccessful. She has tried counting calories and regular physical activity, phentermine, as well as supervised weight loss visits with the dietitian-all without any long-term success  Her comorbidities include hypertension, prediabetes, GERD, obstructive sleep apnea, migraines  She denies any chest pain, chest pressure, shortness of breath, orthopnea, paroxysmal nocturnal dyspnea,  dyspnea on exertion, TIAs or amaurosis fugax. She had a sleep study a year to ago at [facility]. She did not have the titration part of the study. She will have reflux generally at night but it is mostly controlled with reflux medication. She did have an upper endoscopy last year which showed LAD grade a esophagitis, small nonbleeding erosions in the gastric antrum, and negative H. pylori on biopsy. There is no evidence of metaplasia or Barrett's esophagus. She also reports intermittent episodes of colicky mainly upper abdominal pain. In the last 30-40 minutes associated with nausea. Mostly around the intake. No dysphagia. No hematemesis. She had an abdominal ultrasound showed fatty liver, a 5 mm gallbladder polyp. She denies any melena hematochezia. She has daily bowel movements. She's had a partial hysterectomy and 3 C-sections. She denies any dysuria or hematuria. She has chronic left lower back pain without sciatica. She also has neck pain. She is on gabapentin for this. She denies any TIAs or amaurosis fugax. She has migraines a couple times a week. She denies any tobacco or alcohol or drug use. She is a Marine scientist at [employer]  She had recent labs done at the beginning of February which showed a vitamin D deficiency at 20.9, normal B12, comprehensive metabolic panel was normal except for blood glucose of 107, AST of 56, normal CBC, HDL slightly low at 33, LDL elevated at 113; A1c of 6,  she had mod-severe OSA on 2017 sleep study   Problem List/Past Medical Randall Hiss M. 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 Redmond Pulling, MD; 10/15/2018 2:37  PM) Cesarean Section - Multiple Hysterectomy (not due to  cancer) - Partial  Diagnostic Studies History (Eric M. Wilson, MD; 10/15/2018 2:37 PM) Colonoscopy never Mammogram 1-3 years ago Pap Smear 1-5 years ago  Allergies (Tanisha A. Brown, RMA; 10/15/2018 1:54 PM) No Known Drug Allergies [02/09/2018]: Allergies Reconciled  Medication History (Tanisha A. Brown, RMA; 10/15/2018 2:40 PM) SUMAtriptan Succinate (100MG Tablet, Oral) Active. TiZANidine HCl (4MG Tablet, Oral) Active. TraMADol HCl (50MG Tablet, Oral) Active. Gabapentin (300MG Capsule, Oral) Active. Triamterene-HCTZ (37.5-25MG Capsule, Oral) Active. Bisoprolol Fumarate (5MG Tablet, Oral) Active. Magnesium Oxide (400MG Tablet, Oral) Active. Medications Reconciled  Social History (Eric M. Wilson, MD; 10/15/2018 2:37 PM) Alcohol use Occasional alcohol use. Caffeine use Tea. No drug use Tobacco use Never smoker.  Family History (Eric M. Wilson, MD; 10/15/2018 2:37 PM) Hypertension Mother. Respiratory Condition Mother.  Pregnancy / Birth History (Eric M. Wilson, MD; 10/15/2018 2:37 PM) Age at menarche 12 years. Gravida 3 Maternal age 15-20 Para 3  Other Problems (Eric M. Wilson, MD; 10/15/2018 2:37 PM) HYPERTENSION, ESSENTIAL (I10) HEADACHE, VARIANT MIGRAINE (G43.809)     Review of Systems (Eric M. Wilson MD; 10/15/2018 2:32 PM) General Present- Fatigue and Weight Gain. Not Present- Appetite Loss, Chills, Fever, Night Sweats and Weight Loss. Skin Not Present- Change in Wart/Mole, Dryness, Hives, Jaundice, New Lesions, Non-Healing Wounds, Rash and Ulcer. HEENT Not Present- Earache, Hearing Loss, Hoarseness, Nose Bleed, Oral Ulcers, Ringing in the Ears, Seasonal Allergies, Sinus Pain, Sore Throat, Visual Disturbances, Wears glasses/contact lenses and Yellow Eyes. Respiratory Present- Snoring. Not Present- Bloody sputum, Chronic Cough, Difficulty Breathing and Wheezing. Breast Not Present- Breast Mass, Breast Pain, Nipple Discharge and Skin  Changes. Cardiovascular Not Present- Chest Pain, Difficulty Breathing Lying Down, Leg Cramps, Palpitations, Rapid Heart Rate, Shortness of Breath and Swelling of Extremities. Gastrointestinal Not Present- Abdominal Pain, Bloating, Bloody Stool, Change in Bowel Habits, Chronic diarrhea, Constipation, Difficulty Swallowing, Excessive gas, Gets full quickly at meals, Hemorrhoids, Indigestion, Nausea, Rectal Pain and Vomiting. Female Genitourinary Not Present- Frequency, Nocturia, Painful Urination, Pelvic Pain and Urgency. Musculoskeletal Not Present- Back Pain, Joint Pain, Joint Stiffness, Muscle Pain, Muscle Weakness and Swelling of Extremities. Neurological Not Present- Decreased Memory, Fainting, Headaches, Numbness, Seizures, Tingling, Tremor, Trouble walking and Weakness. Psychiatric Not Present- Anxiety, Bipolar, Change in Sleep Pattern, Depression, Fearful and Frequent crying. Endocrine Not Present- Cold Intolerance, Excessive Hunger, Hair Changes, Heat Intolerance, Hot flashes and New Diabetes.  Vitals (Tanisha A. Brown RMA; 10/15/2018 1:54 PM) 10/15/2018 1:53 PM Weight: 236 lb Height: 61in Body Surface Area: 2.03 m Body Mass Index: 44.59 kg/m  Pulse: 87 (Regular)  BP: 126/84 (Sitting, Left Arm, Standard)      Physical Exam (Eric M. Wilson MD; 10/15/2018 2:33 PM)  General Mental Status-Alert. General Appearance-Consistent with stated age. Hydration-Well hydrated. Voice-Normal. Note: severe obesity; evenly distributed  Head and Neck Head-normocephalic, atraumatic with no lesions or palpable masses. Trachea-midline. Thyroid Gland Characteristics - normal size and consistency.  Eye Eyeball - Bilateral-Extraocular movements intact. Sclera/Conjunctiva - Bilateral-No scleral icterus.  ENMT Ears -Note:normal ext ears lips intact.   Chest and Lung Exam Chest and lung exam reveals -quiet, even and easy respiratory effort with no use of  accessory muscles and on auscultation, normal breath sounds, no adventitious sounds and normal vocal resonance. Inspection Chest Wall - Normal. Back - normal.  Breast - Did not examine.  Cardiovascular Cardiovascular examination reveals -normal heart sounds, regular rate and rhythm with no murmurs and normal pedal pulses bilaterally.  Abdomen Inspection Inspection of the   abdomen reveals - No Hernias. Skin - Scar - Note: old c/s scar. Palpation/Percussion Palpation and Percussion of the abdomen reveal - Soft, Non Tender, No Rebound tenderness, No Rigidity (guarding) and No hepatosplenomegaly. Auscultation Auscultation of the abdomen reveals - Bowel sounds normal.  Peripheral Vascular Upper Extremity Palpation - Pulses bilaterally normal.  Neurologic Neurologic evaluation reveals -alert and oriented x 3 with no impairment of recent or remote memory. Mental Status-Normal.  Neuropsychiatric The patient's mood and affect are described as -normal. Judgment and Insight-insight is appropriate concerning matters relevant to self.  Musculoskeletal Normal Exam - Left-Upper Extremity Strength Normal and Lower Extremity Strength Normal. Normal Exam - Right-Upper Extremity Strength Normal and Lower Extremity Strength Normal.  Lymphatic Head & Neck  General Head & Neck Lymphatics: Bilateral - Description - Normal. Axillary - Did not examine. Femoral & Inguinal - Did not examine.    Assessment & Plan (Eric M. Wilson MD; 10/15/2018 2:37 PM)  MORBID OBESITY (E66.01) Impression: The patient meets weight loss surgery criteria. I think the patient would be an acceptable candidate for Laparoscopic Roux-en-Y Gastric bypass.  We discussed her surgical evaluation. She had a prior upper endoscopy that showed esophagitis but no evidence of hiatal hernia. We reviewed the typical hospitalization as well as the typical recovery. We discussed the typical issues that we commonly see  after gastric bypass. We discussed the preoperative diet. All of her questions were asked and answered.  I did offer to go over gastric bypass risk/benefits with pt's husband but he declined  Current Plans Pt Education - EMW_preopbariatric  PREDIABETES (R73.03)   FATTY LIVER (K76.0)   ELEVATED LDL CHOLESTEROL LEVEL (E78.00)   GASTROESOPHAGEAL REFLUX DISEASE WITH ESOPHAGITIS (K21.0) Impression: With her history of reflux and nonbleeding erosions on prior upper endoscopy we discussed that in the long run a Roux-en-Y gastric bypass would probably be better than the sleeve gastrectomy. We discussed the possibility of increased reflux-heartburn with sleeve gastrectomy.   VITAMIN D DEFICIENCY (E55.9)   OSA (OBSTRUCTIVE SLEEP APNEA) (G47.33)   BILIARY COLIC (K80.50) Impression: She does relate ongoing episodes of upper abdominal-mid abdominal right flank intermittent pain that last a few hours associated with nausea. Her ultrasound showed no evidence of gallstones. We did discuss the possibility of biliary dyskinesia. We will check a nuclear medicine scan. If positive then we can proceed with cholecystectomy same day as gastric bypass. If nuclear medicine scan is negative then we will need to discuss that over the phone whether or not we proceed with same day cholecystectomy since the likelihood of amelioration of her symptoms would be hard to say based on negative imaging.  I discussed laparoscopic cholecystectomy with possible IOC in detail. The patient was given educational material as well as diagrams detailing the procedure. We discussed the risks and benefits of a laparoscopic cholecystectomy including, but not limited to bleeding, infection, injury to surrounding structures such as the intestine or liver, bile leak, retained gallstones, need to convert to an open procedure, prolonged diarrhea, blood clots such as DVT, common bile duct injury, anesthesia risks, and possible need for  additional procedures.  we did discuss that if she were to have bile leak or CBD injury during cholecystectomy at same time as gastric bypass then her perioperative risks would be significantly elevated.   Eric M. Wilson, MD, FACS General, Bariatric, & Minimally Invasive Surgery [facility]   

## 2018-10-17 ENCOUNTER — Telehealth: Payer: 59 | Admitting: Physician Assistant

## 2018-10-17 DIAGNOSIS — B029 Zoster without complications: Secondary | ICD-10-CM | POA: Diagnosis not present

## 2018-10-17 MED ORDER — VALACYCLOVIR HCL 1 G PO TABS: 1000.0000 mg | ORAL_TABLET | Freq: Three times a day (TID) | ORAL | 0 refills | Status: AC

## 2018-10-17 NOTE — Progress Notes (Signed)
E-visit for Shingles   We are sorry that you are not feeling well. Here is how we plan to help!  Based on what you shared with me it looks like you have shingles.  Shingles or herpes zoster, is a common infection of the nerves.  It is a painful rash caused by the herpes zoster virus.  This is the same virus that causes chickenpox.  After a person has chickenpox, the virus remains inactive in the nerve cells.  Years later, the virus can become active again and travel to the skin.  It typically will appear on one side of the face or body.  Burning or shooting pain, tingling, or itching are early signs of the infection.  Blisters typically scab over in 7 to 10 days and clear up within 2-4 weeks. Shingles is only contagious to people that have never had the chickenpox, the chickenpox vaccine, or anyone who has a compromised immune system.  You should avoid contact with these type of people until your blisters scab over.  I have prescribed Valacyclovir 1g three times daily for 7 days.  Alternate tylenol and Ibuprofen to help with pain. OTC lidocaine cream can also help with pain. The Gabapentin you take each night should also help with pain. You can increase to 300 mg (1 capsule) twice daily while outbreak is present.  HOME CARE: . Apply ice packs (wrapped in a thin towel), cool compresses, or soak in cool bath to help reduce pain. . Use calamine lotion to calm itchy skin. . Avoid scratching the rash. . Avoid direct sunlight.  GET HELP RIGHT AWAY IF: . Symptoms that don't away after treatment. . A rash or blisters near your eye. . Increased drainage, fever, or rash after treatment. . Severe pain that doesn't go away.   MAKE SURE YOU    Understand these instructions.  Will watch your condition.  Will get help right away if you are not doing well or get worse.  Thank you for choosing an e-visit. Your e-visit answers were reviewed by a board certified advanced clinical practitioner to  complete your personal care plan. Depending upon the condition, your plan could have included both over the counter or prescription medications.  Please review your pharmacy choice. Make sure the pharmacy is open so you can pick up prescription now. If there is a problem, you may contact your provider through [employer] messaging and have the prescription routed to another pharmacy.  Your safety is important to us. If you have drug allergies check your prescription carefully.   For the next 24 hours you can use MyChart to ask questions about today's visit, request a non-urgent call back, or ask for a work or school excuse.  You will get an email in the next two days asking about your experience. I hope that your e-visit has been valuable and will speed your recovery

## 2018-10-19 ENCOUNTER — Other Ambulatory Visit (HOSPITAL_COMMUNITY): Payer: Self-pay | Admitting: General Surgery

## 2018-10-19 DIAGNOSIS — K8050 Calculus of bile duct without cholangitis or cholecystitis without obstruction: Secondary | ICD-10-CM

## 2018-10-20 NOTE — Patient Instructions (Addendum)
Victoria Silva  10/20/2018   Your procedure is scheduled on: 10-26-18   Report to [facility] Main  Entrance     Report to admitting at 5:30 AM    Call this number if you have problems the morning of surgery [phone]     Remember: Do not eat food  :After 6:00pm night before surgery. YOU MAY DRINK CLEAR FLUIDS. MORNING OF SURGERY DRINK:  1SHAKE BEFORE YOU LEAVE HOME, DRINK ALL OF THE SHAKE AT ONE TIME.  THE SHAKE YOU DRINK BEFORE YOU LEAVE HOME WILL BE THE LAST FLUIDS YOU DRINK BEFORE SURGERY. BRUSH YOUR TEETH MORNING OF SURGERY AND RINSE YOUR MOUTH OUT, NO CHEWING GUM CANDY OR MINTS.     CLEAR LIQUID DIET   Foods Allowed                                                                     Foods Excluded  Coffee and tea, regular and decaf                             liquids that you cannot  Plain Jell-O in any flavor                                             see through such as: Fruit ices (not with fruit pulp)                                     milk, soups, orange juice  Iced Popsicles                                    All solid food Carbonated beverages, regular and diet                                    Cranberry, grape and apple juices Sports drinks like Gatorade Lightly seasoned clear broth or consume(fat free) Sugar, honey syrup  Sample Menu Breakfast                                Lunch                                     Supper Cranberry juice                    Beef broth                            Chicken broth Jell-O  Grape juice                           Apple juice Coffee or tea                        Jell-O                                      Popsicle                                                Coffee or tea                        Coffee or tea  ______________________________________________________________________________________________________________________________________          Take these medicines the morning of surgery with A SIP OF WATER: GABAPENTIN (NEURONTIN), PANTAPRAZOLE (PROTONIX)                                You may not have any metal on your body including hair pins and              piercings  Do not wear jewelry, make-up, lotions, powders or perfumes, deodorant             Do not wear nail polish.  Do not shave  48 hours prior to surgery.               Do not bring valuables to the hospital. [facility] IS NOT             RESPONSIBLE   FOR VALUABLES.  Contacts, dentures or bridgework may not be worn into surgery.  Leave suitcase in the car. After surgery it may be brought to your room.                 Please read over the following fact sheets you were given:   PAIN IS EXPECTED AFTER SURGERY AND WILL NOT BE COMPLETELY ELIMINATED. AMBULATION AND TYLENOL WILL HELP REDUCE INCISIONAL AND GAS PAIN. MOVEMENT IS KEY!  YOU ARE EXPECTED TO BE OUT OF BED WITHIN 4 HOURS OF ADMISSION TO YOUR PATIENT ROOM.  SITTING IN THE RECLINER THROUGHOUT THE DAY IS IMPORTANT FOR DRINKING FLUIDS AND MOVING GAS THROUGHOUT THE GI TRACT.  COMPRESSION STOCKINGS SHOULD BE WORN [facility]OUGHOUT YOUR HOSPITAL STAY UNLESS YOU ARE WALKING.   INCENTIVE SPIROMETER SHOULD BE USED EVERY HOUR WHILE AWAKE TO DECREASE POST-OPERATIVE COMPLICATIONS SUCH AS PNEUMONIA.  WHEN DISCHARGED HOME, IT IS IMPORTANT TO CONTINUE TO WALK EVERY HOUR AND USE THE INCENTIVE SPIROMETER EVERY HOUR.   _____________________________________________________________________  [facility] Before surgery, you can play an important role.  Because skin is not sterile, your skin needs to be as free of germs as possible.  You can reduce the number of germs on your skin by washing with CHG (chlorahexidine gluconate) soap before surgery.  CHG is an antiseptic cleaner which kills germs and bonds with the skin to continue killing germs even after washing. Please DO NOT use if you have an allergy to  CHG or antibacterial soaps.  If your skin becomes  reddened/irritated stop using the CHG and inform your nurse when you arrive at Short Stay. Do not shave (including legs and underarms) for at least 48 hours prior to the first CHG shower.  You may shave your face/neck. Please follow these instructions carefully:  1.  Shower with CHG Soap the night before surgery and the  morning of Surgery.  2.  If you choose to wash your hair, wash your hair first as usual with your  normal  shampoo.  3.  After you shampoo, rinse your hair and body thoroughly to remove the  shampoo.                           4.  Use CHG as you would any other liquid soap.  You can apply chg directly  to the skin and wash                       Gently with a scrungie or clean washcloth.  5.  Apply the CHG Soap to your body ONLY FROM THE NECK DOWN.   Do not use on face/ open                           Wound or open sores. Avoid contact with eyes, ears mouth and genitals (private parts).                       Wash face,  Genitals (private parts) with your normal soap.             6.  Wash thoroughly, paying special attention to the area where your surgery  will be performed.  7.  Thoroughly rinse your body with warm water from the neck down.  8.  DO NOT shower/wash with your normal soap after using and rinsing off  the CHG Soap.                9.  Pat yourself dry with a clean towel.            10.  Wear clean pajamas.            11.  Place clean sheets on your bed the night of your first shower and do not  sleep with pets. Day of Surgery : Do not apply any lotions/deodorants the morning of surgery.  Please wear clean clothes to the hospital/surgery center.  FAILURE TO FOLLOW THESE INSTRUCTIONS MAY RESULT IN THE CANCELLATION OF YOUR SURGERY PATIENT SIGNATURE_________________________________  NURSE SIGNATURE__________________________________  ________________________________________________________________________   [name]  An incentive spirometer is a tool that can help keep your lungs clear and active. This tool measures how well you are filling your lungs with each breath. Taking long deep breaths may help reverse or decrease the chance of developing breathing (pulmonary) problems (especially infection) following:  A long period of time when you are unable to move or be active. BEFORE THE PROCEDURE   If the spirometer includes an indicator to show your best effort, your nurse or respiratory therapist will set it to a desired goal.  If possible, sit up straight or lean slightly forward. Try not to slouch.  Hold the incentive spirometer in an upright position. INSTRUCTIONS FOR USE  1. Sit on the edge of your bed if possible, or sit up as far as you can in bed or on a chair. 2. Hold the  incentive spirometer in an upright position. 3. Breathe out normally. 4. Place the mouthpiece in your mouth and seal your lips tightly around it. 5. Breathe in slowly and as deeply as possible, raising the piston or the ball toward the top of the column. 6. Hold your breath for 3-5 seconds or for as long as possible. Allow the piston or ball to fall to the bottom of the column. 7. Remove the mouthpiece from your mouth and breathe out normally. 8. Rest for a few seconds and repeat Steps 1 through 7 at least 10 times every 1-2 hours when you are awake. Take your time and take a few normal breaths between deep breaths. 9. The spirometer may include an indicator to show your best effort. Use the indicator as a goal to work toward during each repetition. 10. After each set of 10 deep breaths, practice coughing to be sure your lungs are clear. If you have an incision (the cut made at the time of surgery), support your incision when coughing by placing a pillow or rolled up towels firmly against it. Once you are able to get out of bed, walk around indoors and cough well. You may stop using the incentive spirometer when  instructed by your caregiver.  RISKS AND COMPLICATIONS  Take your time so you do not get dizzy or light-headed.  If you are in pain, you may need to take or ask for pain medication before doing incentive spirometry. It is harder to take a deep breath if you are having pain. AFTER USE  Rest and breathe slowly and easily.  It can be helpful to keep track of a log of your progress. Your caregiver can provide you with a simple table to help with this. If you are using the spirometer at home, follow these instructions: SEEK MEDICAL CARE IF:   You are having difficultly using the spirometer.  You have trouble using the spirometer as often as instructed.  Your pain medication is not giving enough relief while using the spirometer.  You develop fever of 100.5 F (38.1 C) or higher. SEEK IMMEDIATE MEDICAL CARE IF:   You cough up bloody sputum that had not been present before.  You develop fever of 102 F (38.9 C) or greater.  You develop worsening pain at or near the incision site. MAKE SURE YOU:   Understand these instructions.  Will watch your condition.  Will get help right away if you are not doing well or get worse. Document Released: 03/30/2007 Document Revised: 02/09/2012 Document Reviewed: 05/31/2007 [facility] Patient Information 2014 [place], [place].   ________________________________________________________________________

## 2018-10-20 NOTE — Progress Notes (Signed)
[facility] CARDIOLOG[facility] DR. Dossie ArbourIM GOLLAN 02-05-17 EPIC

## 2018-10-21 ENCOUNTER — Encounter (HOSPITAL_COMMUNITY): Payer: Self-pay

## 2018-10-21 ENCOUNTER — Encounter (HOSPITAL_COMMUNITY)
Admission: RE | Admit: 2018-10-21 | Discharge: 2018-10-21 | Disposition: A | Discharge status: Home / Self Care | Payer: 59 | Source: Ambulatory Visit | Attending: General Surgery | Admitting: General Surgery

## 2018-10-21 ENCOUNTER — Other Ambulatory Visit: Payer: Self-pay

## 2018-10-21 DIAGNOSIS — Z01818 Encounter for other preprocedural examination: Secondary | ICD-10-CM | POA: Diagnosis not present

## 2018-10-21 HISTORY — DX: Other cervical disc degeneration, unspecified cervical region: M50.30

## 2018-10-21 HISTORY — DX: Sleep apnea, unspecified: G47.30

## 2018-10-21 HISTORY — DX: Rash and other nonspecific skin eruption: R21

## 2018-10-21 HISTORY — DX: Other cervical disc displacement, unspecified cervical region: M50.20

## 2018-10-21 HISTORY — DX: Gastro-esophageal reflux disease without esophagitis: K21.9

## 2018-10-21 HISTORY — DX: Fatty (change of) liver, not elsewhere classified: K76.0

## 2018-10-21 LAB — CBC WITH DIFFERENTIAL/PLATELET
Abs Immature Granulocytes: 0.03 10*3/uL (ref 0.00–0.07)
BASOS ABS: 0.1 10*3/uL (ref 0.0–0.1)
Basophils Relative: 0 %
EOS ABS: 0.4 10*3/uL (ref 0.0–0.5)
Eosinophils Relative: 3 %
HEMATOCRIT: 42.6 % (ref 36.0–46.0)
HEMOGLOBIN: 14.1 g/dL (ref 12.0–15.0)
Immature Granulocytes: 0 %
LYMPHS ABS: 3.6 10*3/uL (ref 0.7–4.0)
Lymphocytes Relative: 29 %
MCH: 29.2 pg (ref 26.0–34.0)
MCHC: 33.1 g/dL (ref 30.0–36.0)
MCV: 88.2 fL (ref 80.0–100.0)
MONO ABS: 0.8 10*3/uL (ref 0.1–1.0)
Monocytes Relative: 6 %
NRBC: 0 % (ref 0.0–0.2)
Neutro Abs: 7.6 10*3/uL (ref 1.7–7.7)
Neutrophils Relative %: 62 %
Platelets: 338 10*3/uL (ref 150–400)
RBC: 4.83 MIL/uL (ref 3.87–5.11)
RDW: 12.5 % (ref 11.5–15.5)
WBC: 12.5 10*3/uL — AB (ref 4.0–10.5)

## 2018-10-21 LAB — COMPREHENSIVE METABOLIC PANEL
ALBUMIN: 4.4 g/dL (ref 3.5–5.0)
ALK PHOS: 64 U/L (ref 38–126)
ALT: 51 U/L — ABNORMAL HIGH (ref 0–44)
ANION GAP: 9 (ref 5–15)
AST: 41 U/L (ref 15–41)
BILIRUBIN TOTAL: 1.4 mg/dL — AB (ref 0.3–1.2)
BUN: 16 mg/dL (ref 6–20)
CALCIUM: 9.6 mg/dL (ref 8.9–10.3)
CO2: 28 mmol/L (ref 22–32)
Chloride: 100 mmol/L (ref 98–111)
Creatinine, Ser: 0.63 mg/dL (ref 0.44–1.00)
GFR calc Af Amer: >60 mL/min (ref >60)
GLUCOSE: 87 mg/dL (ref 70–99)
Potassium: 3.7 mmol/L (ref 3.5–5.1)
Sodium: 137 mmol/L (ref 135–145)
TOTAL PROTEIN: 8.1 g/dL (ref 6.5–8.1)

## 2018-10-21 LAB — ABO/RH: ABO/RH(D): O NEG

## 2018-10-22 ENCOUNTER — Encounter (HOSPITAL_COMMUNITY): Payer: 59

## 2018-10-22 ENCOUNTER — Encounter (HOSPITAL_COMMUNITY): Payer: Self-pay

## 2018-10-22 ENCOUNTER — Other Ambulatory Visit (HOSPITAL_COMMUNITY): Payer: Self-pay | Admitting: Emergency Medicine

## 2018-10-25 MED ORDER — BUPIVACAINE LIPOSOME 1.3 % IJ SUSP
20.0000 mL | INTRAMUSCULAR | Status: DC
  Filled 2018-10-25: qty 20

## 2018-10-26 ENCOUNTER — Inpatient Hospital Stay (HOSPITAL_COMMUNITY): Payer: 59 | Admitting: Certified Registered"

## 2018-10-26 ENCOUNTER — Other Ambulatory Visit: Payer: Self-pay

## 2018-10-26 ENCOUNTER — Inpatient Hospital Stay (HOSPITAL_COMMUNITY)
Admission: RE | Admit: 2018-10-26 | Discharge: 2018-10-27 | DRG: 621 | Disposition: A | Discharge status: Home / Self Care | Payer: 59 | Source: Ambulatory Visit | Attending: General Surgery | Admitting: General Surgery

## 2018-10-26 ENCOUNTER — Encounter (HOSPITAL_COMMUNITY): Payer: Self-pay | Admitting: *Deleted

## 2018-10-26 ENCOUNTER — Encounter (HOSPITAL_COMMUNITY)
Admission: RE | Disposition: A | Discharge status: Home / Self Care | Payer: Self-pay | Source: Ambulatory Visit | Attending: General Surgery

## 2018-10-26 ENCOUNTER — Inpatient Hospital Stay (HOSPITAL_COMMUNITY): Payer: 59

## 2018-10-26 DIAGNOSIS — Z419 Encounter for procedure for purposes other than remedying health state, unspecified: Secondary | ICD-10-CM

## 2018-10-26 DIAGNOSIS — I10 Essential (primary) hypertension: Secondary | ICD-10-CM | POA: Diagnosis present

## 2018-10-26 DIAGNOSIS — R7303 Prediabetes: Secondary | ICD-10-CM | POA: Diagnosis not present

## 2018-10-26 DIAGNOSIS — J4520 Mild intermittent asthma, uncomplicated: Secondary | ICD-10-CM | POA: Diagnosis not present

## 2018-10-26 DIAGNOSIS — K824 Cholesterolosis of gallbladder: Secondary | ICD-10-CM | POA: Diagnosis present

## 2018-10-26 DIAGNOSIS — K219 Gastro-esophageal reflux disease without esophagitis: Secondary | ICD-10-CM | POA: Diagnosis not present

## 2018-10-26 DIAGNOSIS — K811 Chronic cholecystitis: Secondary | ICD-10-CM | POA: Diagnosis not present

## 2018-10-26 DIAGNOSIS — K828 Other specified diseases of gallbladder: Secondary | ICD-10-CM | POA: Diagnosis present

## 2018-10-26 DIAGNOSIS — G43909 Migraine, unspecified, not intractable, without status migrainosus: Secondary | ICD-10-CM | POA: Diagnosis present

## 2018-10-26 DIAGNOSIS — E282 Polycystic ovarian syndrome: Secondary | ICD-10-CM | POA: Diagnosis not present

## 2018-10-26 DIAGNOSIS — Z6841 Body Mass Index (BMI) 40.0 and over, adult: Secondary | ICD-10-CM

## 2018-10-26 DIAGNOSIS — G4733 Obstructive sleep apnea (adult) (pediatric): Secondary | ICD-10-CM | POA: Diagnosis present

## 2018-10-26 DIAGNOSIS — E559 Vitamin D deficiency, unspecified: Secondary | ICD-10-CM | POA: Diagnosis present

## 2018-10-26 DIAGNOSIS — Z79899 Other long term (current) drug therapy: Secondary | ICD-10-CM | POA: Diagnosis not present

## 2018-10-26 DIAGNOSIS — K760 Fatty (change of) liver, not elsewhere classified: Secondary | ICD-10-CM | POA: Diagnosis present

## 2018-10-26 DIAGNOSIS — K210 Gastro-esophageal reflux disease with esophagitis: Secondary | ICD-10-CM | POA: Diagnosis present

## 2018-10-26 DIAGNOSIS — M545 Low back pain: Secondary | ICD-10-CM | POA: Diagnosis present

## 2018-10-26 DIAGNOSIS — G8929 Other chronic pain: Secondary | ICD-10-CM | POA: Diagnosis present

## 2018-10-26 HISTORY — PX: GASTRIC ROUX-EN-Y: SHX5262

## 2018-10-26 HISTORY — PX: CHOLECYSTECTOMY: SHX55

## 2018-10-26 LAB — HEMOGLOBIN AND HEMATOCRIT, BLOOD
HCT: 39.3 % (ref 36.0–46.0)
Hemoglobin: 13.2 g/dL (ref 12.0–15.0)

## 2018-10-26 LAB — TYPE AND SCREEN
ABO/RH(D): O NEG
ANTIBODY SCREEN: NEGATIVE

## 2018-10-26 SURGERY — LAPAROSCOPIC ROUX-EN-Y GASTRIC BYPASS WITH UPPER ENDOSCOPY
Anesthesia: General | Site: Abdomen

## 2018-10-26 MED ORDER — PROMETHAZINE HCL 25 MG/ML IJ SOLN
INTRAMUSCULAR | Status: AC
  Filled 2018-10-26: qty 1

## 2018-10-26 MED ORDER — DEXAMETHASONE SODIUM PHOSPHATE 4 MG/ML IJ SOLN
4.0000 mg | INTRAMUSCULAR | Status: AC
  Administered 2018-10-26: 4 mg via INTRAVENOUS

## 2018-10-26 MED ORDER — ONDANSETRON HCL 4 MG/2ML IJ SOLN
INTRAMUSCULAR | Status: AC
  Filled 2018-10-26: qty 2

## 2018-10-26 MED ORDER — KETAMINE HCL 10 MG/ML IJ SOLN
INTRAMUSCULAR | Status: AC
  Filled 2018-10-26: qty 1

## 2018-10-26 MED ORDER — LACTATED RINGERS IV SOLN
INTRAVENOUS | Status: DC
  Administered 2018-10-26: 06:00:00 via INTRAVENOUS

## 2018-10-26 MED ORDER — ONDANSETRON HCL 4 MG/2ML IJ SOLN
4.0000 mg | Freq: Four times a day (QID) | INTRAMUSCULAR | Status: DC | PRN
  Administered 2018-10-26: 4 mg via INTRAVENOUS

## 2018-10-26 MED ORDER — ONDANSETRON HCL 4 MG/2ML IJ SOLN
INTRAMUSCULAR | Status: DC | PRN
  Administered 2018-10-26: 4 mg via INTRAVENOUS

## 2018-10-26 MED ORDER — PHENYLEPHRINE HCL 10 MG/ML IJ SOLN
INTRAMUSCULAR | Status: DC | PRN
  Administered 2018-10-26: 80 ug via INTRAVENOUS
  Administered 2018-10-26: 120 ug via INTRAVENOUS
  Administered 2018-10-26: 40 ug via INTRAVENOUS

## 2018-10-26 MED ORDER — HEPARIN SODIUM (PORCINE) 5000 UNIT/ML IJ SOLN
5000.0000 [IU] | INTRAMUSCULAR | Status: AC
  Administered 2018-10-26: 5000 [IU] via SUBCUTANEOUS
  Filled 2018-10-26: qty 1

## 2018-10-26 MED ORDER — MIDAZOLAM HCL 2 MG/2ML IJ SOLN
INTRAMUSCULAR | Status: AC
  Filled 2018-10-26: qty 2

## 2018-10-26 MED ORDER — SODIUM CHLORIDE (PF) 0.9 % IJ SOLN
INTRAMUSCULAR | Status: DC | PRN
  Administered 2018-10-26: 50 mL via INTRAVENOUS

## 2018-10-26 MED ORDER — ACETAMINOPHEN 160 MG/5ML PO SOLN
650.0000 mg | Freq: Four times a day (QID) | ORAL | Status: DC
  Administered 2018-10-26 – 2018-10-27 (×4): 650 mg via ORAL
  Filled 2018-10-26 (×4): qty 20.3

## 2018-10-26 MED ORDER — SCOPOLAMINE 1 MG/3DAYS TD PT72
1.0000 | MEDICATED_PATCH | TRANSDERMAL | Status: DC
  Administered 2018-10-26: 1.5 mg via TRANSDERMAL
  Filled 2018-10-26: qty 1

## 2018-10-26 MED ORDER — LIDOCAINE HCL 2 % IJ SOLN
INTRAMUSCULAR | Status: AC
  Filled 2018-10-26: qty 20

## 2018-10-26 MED ORDER — OXYCODONE HCL 5 MG PO TABS: 5.0000 mg | ORAL_TABLET | Freq: Once | ORAL | Status: DC | PRN

## 2018-10-26 MED ORDER — SODIUM CHLORIDE (PF) 0.9 % IJ SOLN
INTRAMUSCULAR | Status: AC
  Filled 2018-10-26: qty 50

## 2018-10-26 MED ORDER — PANTOPRAZOLE SODIUM 40 MG IV SOLR
40.0000 mg | Freq: Every day | INTRAVENOUS | Status: DC
  Administered 2018-10-26: 40 mg via INTRAVENOUS
  Filled 2018-10-26: qty 40

## 2018-10-26 MED ORDER — GABAPENTIN 300 MG PO CAPS
300.0000 mg | ORAL_CAPSULE | ORAL | Status: DC
  Filled 2018-10-26: qty 1

## 2018-10-26 MED ORDER — LIDOCAINE 2% (20 MG/ML) 5 ML SYRINGE
INTRAMUSCULAR | Status: AC
  Filled 2018-10-26: qty 5

## 2018-10-26 MED ORDER — 0.9 % SODIUM CHLORIDE (POUR BTL) OPTIME
TOPICAL | Status: DC | PRN
  Administered 2018-10-26: 1000 mL

## 2018-10-26 MED ORDER — APREPITANT 40 MG PO CAPS
40.0000 mg | ORAL_CAPSULE | ORAL | Status: AC
  Administered 2018-10-26: 40 mg via ORAL
  Filled 2018-10-26: qty 1

## 2018-10-26 MED ORDER — ROCURONIUM BROMIDE 100 MG/10ML IV SOLN
INTRAVENOUS | Status: AC
  Filled 2018-10-26: qty 1

## 2018-10-26 MED ORDER — PROPOFOL 10 MG/ML IV BOLUS
INTRAVENOUS | Status: DC | PRN
  Administered 2018-10-26: 200 mg via INTRAVENOUS

## 2018-10-26 MED ORDER — MIDAZOLAM HCL 2 MG/2ML IJ SOLN
INTRAMUSCULAR | Status: DC | PRN
  Administered 2018-10-26: 2 mg via INTRAVENOUS

## 2018-10-26 MED ORDER — FENTANYL CITRATE (PF) 100 MCG/2ML IJ SOLN
25.0000 ug | INTRAMUSCULAR | Status: DC | PRN
  Administered 2018-10-26 (×2): 50 ug via INTRAVENOUS

## 2018-10-26 MED ORDER — BUPIVACAINE LIPOSOME 1.3 % IJ SUSP
INTRAMUSCULAR | Status: DC | PRN
  Administered 2018-10-26: 20 mL

## 2018-10-26 MED ORDER — KCL IN DEXTROSE-NACL 20-5-0.45 MEQ/L-%-% IV SOLN
INTRAVENOUS | Status: DC
  Administered 2018-10-26 – 2018-10-27 (×3): via INTRAVENOUS
  Filled 2018-10-26 (×3): qty 1000

## 2018-10-26 MED ORDER — EPHEDRINE SULFATE 50 MG/ML IJ SOLN
INTRAMUSCULAR | Status: DC | PRN
  Administered 2018-10-26: 10 mg via INTRAVENOUS
  Administered 2018-10-26 (×2): 5 mg via INTRAVENOUS

## 2018-10-26 MED ORDER — ACETAMINOPHEN 500 MG PO TABS
1000.0000 mg | ORAL_TABLET | ORAL | Status: AC
  Administered 2018-10-26: 1000 mg via ORAL
  Filled 2018-10-26: qty 2

## 2018-10-26 MED ORDER — SUGAMMADEX SODIUM 500 MG/5ML IV SOLN
INTRAVENOUS | Status: AC
  Filled 2018-10-26: qty 5

## 2018-10-26 MED ORDER — MORPHINE SULFATE (PF) 4 MG/ML IV SOLN
1.0000 mg | INTRAVENOUS | Status: DC | PRN
  Administered 2018-10-26 (×2): 2 mg via INTRAVENOUS
  Filled 2018-10-26: qty 1

## 2018-10-26 MED ORDER — ENALAPRILAT 1.25 MG/ML IV SOLN
1.2500 mg | Freq: Four times a day (QID) | INTRAVENOUS | Status: DC | PRN
  Filled 2018-10-26: qty 1

## 2018-10-26 MED ORDER — CHLORHEXIDINE GLUCONATE 4 % EX LIQD: 60.0000 mL | Freq: Once | CUTANEOUS | Status: DC

## 2018-10-26 MED ORDER — LACTATED RINGERS IR SOLN
Status: DC | PRN
  Administered 2018-10-26: 1000 mL

## 2018-10-26 MED ORDER — EVICEL 5 ML EX KIT
PACK | Freq: Once | CUTANEOUS | Status: AC
  Administered 2018-10-26: 1
  Filled 2018-10-26: qty 1

## 2018-10-26 MED ORDER — PROPOFOL 10 MG/ML IV BOLUS
INTRAVENOUS | Status: AC
  Filled 2018-10-26: qty 40

## 2018-10-26 MED ORDER — LIDOCAINE 2% (20 MG/ML) 5 ML SYRINGE
INTRAMUSCULAR | Status: DC | PRN
  Administered 2018-10-26: 1.5 mg/kg/h via INTRAVENOUS

## 2018-10-26 MED ORDER — KETOROLAC TROMETHAMINE 15 MG/ML IJ SOLN
15.0000 mg | Freq: Two times a day (BID) | INTRAMUSCULAR | Status: DC | PRN
  Administered 2018-10-27: 15 mg via INTRAVENOUS
  Filled 2018-10-26: qty 1

## 2018-10-26 MED ORDER — PHENYLEPHRINE 40 MCG/ML (10ML) SYRINGE FOR IV PUSH (FOR BLOOD PRESSURE SUPPORT)
PREFILLED_SYRINGE | INTRAVENOUS | Status: AC
  Filled 2018-10-26: qty 10

## 2018-10-26 MED ORDER — PROMETHAZINE HCL 25 MG/ML IJ SOLN: 12.5000 mg | Freq: Four times a day (QID) | INTRAMUSCULAR | Status: DC | PRN

## 2018-10-26 MED ORDER — GABAPENTIN 300 MG PO CAPS
300.0000 mg | ORAL_CAPSULE | Freq: Two times a day (BID) | ORAL | Status: DC
  Administered 2018-10-26 – 2018-10-27 (×2): 300 mg via ORAL
  Filled 2018-10-26 (×2): qty 1

## 2018-10-26 MED ORDER — STERILE WATER FOR IRRIGATION IR SOLN
Status: DC | PRN
  Administered 2018-10-26: 1000 mL

## 2018-10-26 MED ORDER — PREMIER PROTEIN SHAKE
2.0000 [oz_av] | ORAL | Status: DC
  Administered 2018-10-27 (×3): 2 [oz_av] via ORAL

## 2018-10-26 MED ORDER — LIDOCAINE 2% (20 MG/ML) 5 ML SYRINGE
INTRAMUSCULAR | Status: DC | PRN
  Administered 2018-10-26: 50 mg via INTRAVENOUS

## 2018-10-26 MED ORDER — KETAMINE HCL 10 MG/ML IJ SOLN
INTRAMUSCULAR | Status: DC | PRN
  Administered 2018-10-26: 5 mg via INTRAVENOUS
  Administered 2018-10-26: 20 mg via INTRAVENOUS

## 2018-10-26 MED ORDER — DIPHENHYDRAMINE HCL 50 MG/ML IJ SOLN: 12.5000 mg | Freq: Three times a day (TID) | INTRAMUSCULAR | Status: DC | PRN

## 2018-10-26 MED ORDER — SODIUM CHLORIDE 0.9 % IV SOLN
2.0000 g | INTRAVENOUS | Status: AC
  Administered 2018-10-26: 2 g via INTRAVENOUS
  Filled 2018-10-26: qty 2

## 2018-10-26 MED ORDER — DEXAMETHASONE SODIUM PHOSPHATE 10 MG/ML IJ SOLN: INTRAMUSCULAR | Status: DC | PRN

## 2018-10-26 MED ORDER — FENTANYL CITRATE (PF) 250 MCG/5ML IJ SOLN
INTRAMUSCULAR | Status: DC | PRN
  Administered 2018-10-26 (×5): 50 ug via INTRAVENOUS

## 2018-10-26 MED ORDER — PROMETHAZINE HCL 25 MG/ML IJ SOLN
6.2500 mg | INTRAMUSCULAR | Status: DC | PRN
  Administered 2018-10-26: 6.25 mg via INTRAVENOUS

## 2018-10-26 MED ORDER — ROCURONIUM BROMIDE 10 MG/ML (PF) SYRINGE
PREFILLED_SYRINGE | INTRAVENOUS | Status: DC | PRN
  Administered 2018-10-26: 20 mg via INTRAVENOUS
  Administered 2018-10-26: 30 mg via INTRAVENOUS
  Administered 2018-10-26 (×2): 20 mg via INTRAVENOUS
  Administered 2018-10-26: 50 mg via INTRAVENOUS

## 2018-10-26 MED ORDER — FENTANYL CITRATE (PF) 100 MCG/2ML IJ SOLN
INTRAMUSCULAR | Status: AC
  Filled 2018-10-26: qty 4

## 2018-10-26 MED ORDER — BISOPROLOL-HYDROCHLOROTHIAZIDE 5-6.25 MG PO TABS
1.0000 | ORAL_TABLET | Freq: Every day | ORAL | Status: DC
  Administered 2018-10-27: 1 via ORAL
  Filled 2018-10-26: qty 1

## 2018-10-26 MED ORDER — ENOXAPARIN SODIUM 30 MG/0.3ML ~~LOC~~ SOLN
30.0000 mg | Freq: Two times a day (BID) | SUBCUTANEOUS | Status: DC
  Administered 2018-10-26 – 2018-10-27 (×2): 30 mg via SUBCUTANEOUS
  Filled 2018-10-26 (×2): qty 0.3

## 2018-10-26 MED ORDER — KETOROLAC TROMETHAMINE 30 MG/ML IJ SOLN
INTRAMUSCULAR | Status: AC
  Filled 2018-10-26: qty 1

## 2018-10-26 MED ORDER — IOPAMIDOL (ISOVUE-300) INJECTION 61%
INTRAVENOUS | Status: AC
  Filled 2018-10-26: qty 50

## 2018-10-26 MED ORDER — OXYCODONE HCL 5 MG/5ML PO SOLN
5.0000 mg | ORAL | Status: DC | PRN
  Administered 2018-10-26: 10 mg via ORAL
  Administered 2018-10-26 – 2018-10-27 (×2): 5 mg via ORAL
  Administered 2018-10-27: 10 mg via ORAL
  Filled 2018-10-26: qty 5
  Filled 2018-10-26: qty 10
  Filled 2018-10-26: qty 5
  Filled 2018-10-26: qty 10

## 2018-10-26 MED ORDER — OXYCODONE HCL 5 MG/5ML PO SOLN: 5.0000 mg | Freq: Once | ORAL | Status: DC | PRN

## 2018-10-26 MED ORDER — SIMETHICONE 80 MG PO CHEW: 80.0000 mg | CHEWABLE_TABLET | Freq: Four times a day (QID) | ORAL | Status: DC | PRN

## 2018-10-26 MED ORDER — BISOPROLOL FUMARATE 5 MG PO TABS
5.0000 mg | ORAL_TABLET | Freq: Once | ORAL | Status: AC
  Administered 2018-10-26: 5 mg via ORAL
  Filled 2018-10-26: qty 1

## 2018-10-26 MED ORDER — DEXAMETHASONE SODIUM PHOSPHATE 10 MG/ML IJ SOLN
INTRAMUSCULAR | Status: AC
  Filled 2018-10-26: qty 1

## 2018-10-26 MED ORDER — FENTANYL CITRATE (PF) 250 MCG/5ML IJ SOLN
INTRAMUSCULAR | Status: AC
  Filled 2018-10-26: qty 5

## 2018-10-26 MED ORDER — SUGAMMADEX SODIUM 200 MG/2ML IV SOLN
INTRAVENOUS | Status: DC | PRN
  Administered 2018-10-26: 420 mg via INTRAVENOUS

## 2018-10-26 MED ORDER — KETOROLAC TROMETHAMINE 30 MG/ML IJ SOLN
30.0000 mg | Freq: Once | INTRAMUSCULAR | Status: AC | PRN
  Administered 2018-10-26: 30 mg via INTRAVENOUS

## 2018-10-26 SURGICAL SUPPLY — 100 items
APPLICATOR ARISTA FLEXITIP XL (MISCELLANEOUS) IMPLANT
APPLICATOR COTTON TIP 6 STRL (MISCELLANEOUS) IMPLANT
APPLICATOR COTTON TIP 6IN STRL (MISCELLANEOUS)
APPLIER CLIP 5 13 M/L LIGAMAX5 (MISCELLANEOUS)
APPLIER CLIP ROT 10 11.4 M/L (STAPLE)
APPLIER CLIP ROT 13.4 12 LRG (CLIP)
BANDAGE ADH SHEER 1  50/CT (GAUZE/BANDAGES/DRESSINGS) ×24 IMPLANT
BENZOIN TINCTURE PRP APPL 2/3 (GAUZE/BANDAGES/DRESSINGS) ×4 IMPLANT
BLADE SURG SZ11 CARB STEEL (BLADE) ×4 IMPLANT
CABLE HIGH FREQUENCY MONO STRZ (ELECTRODE) ×4 IMPLANT
CHLORAPREP W/TINT 26ML (MISCELLANEOUS) ×4 IMPLANT
CLIP APPLIE 5 13 M/L LIGAMAX5 (MISCELLANEOUS) IMPLANT
CLIP APPLIE ROT 10 11.4 M/L (STAPLE) IMPLANT
CLIP APPLIE ROT 13.4 12 LRG (CLIP) IMPLANT
CLIP SUT LAPRA TY ABSORB (SUTURE) ×8 IMPLANT
CLIP VESOLOCK MED LG 6/CT (CLIP) IMPLANT
CLOSURE WOUND 1/2 X4 (GAUZE/BANDAGES/DRESSINGS) ×1
COVER MAYO STAND STRL (DRAPES) IMPLANT
COVER SURGICAL LIGHT HANDLE (MISCELLANEOUS) ×4 IMPLANT
COVER WAND RF STERILE (DRAPES) IMPLANT
CUTTER FLEX LINEAR 45M (STAPLE) IMPLANT
DECANTER SPIKE VIAL GLASS SM (MISCELLANEOUS) ×4 IMPLANT
DERMABOND ADVANCED (GAUZE/BANDAGES/DRESSINGS)
DERMABOND ADVANCED .7 DNX12 (GAUZE/BANDAGES/DRESSINGS) IMPLANT
DEVICE SUT QUICK LOAD TK 5 (STAPLE) IMPLANT
DEVICE SUT TI-KNOT TK 5X26 (MISCELLANEOUS) IMPLANT
DEVICE SUTURE ENDOST 10MM (ENDOMECHANICALS) ×4 IMPLANT
DEVICE TI KNOT TK5 (MISCELLANEOUS)
DRAIN PENROSE 18X1/4 LTX STRL (WOUND CARE) ×4 IMPLANT
DRAPE C-ARM 42X120 X-RAY (DRAPES) IMPLANT
DRSG TEGADERM 2-3/8X2-3/4 SM (GAUZE/BANDAGES/DRESSINGS) IMPLANT
ELECT PENCIL ROCKER SW 15FT (MISCELLANEOUS) IMPLANT
ELECT REM PT RETURN 15FT ADLT (MISCELLANEOUS) ×4 IMPLANT
GAUZE 4X4 16PLY RFD (DISPOSABLE) ×4 IMPLANT
GAUZE SPONGE 2X2 8PLY STRL LF (GAUZE/BANDAGES/DRESSINGS) IMPLANT
GAUZE SPONGE 4X4 12PLY STRL (GAUZE/BANDAGES/DRESSINGS) IMPLANT
GLOVE BIO SURGEON STRL SZ7.5 (GLOVE) ×4 IMPLANT
GLOVE INDICATOR 8.0 STRL GRN (GLOVE) ×4 IMPLANT
GOWN STRL REUS W/TWL XL LVL3 (GOWN DISPOSABLE) ×16 IMPLANT
GRASPER SUT TROCAR 14GX15 (MISCELLANEOUS) ×4 IMPLANT
HEMOSTAT ARISTA ABSORB 3G PWDR (MISCELLANEOUS) IMPLANT
HEMOSTAT SNOW SURGICEL 2X4 (HEMOSTASIS) IMPLANT
HOVERMATT SINGLE USE (MISCELLANEOUS) ×4 IMPLANT
KIT BASIN OR (CUSTOM PROCEDURE TRAY) ×4 IMPLANT
KIT GASTRIC LAVAGE 34FR ADT (SET/KITS/TRAYS/PACK) ×4 IMPLANT
L-HOOK LAP DISP 36CM (ELECTROSURGICAL)
LHOOK LAP DISP 36CM (ELECTROSURGICAL) IMPLANT
MARKER SKIN DUAL TIP RULER LAB (MISCELLANEOUS) ×4 IMPLANT
NEEDLE SPNL 22GX3.5 QUINCKE BK (NEEDLE) ×4 IMPLANT
PACK CARDIOVASCULAR III (CUSTOM PROCEDURE TRAY) ×4 IMPLANT
POUCH RETRIEVAL ECOSAC 10 (ENDOMECHANICALS) ×2 IMPLANT
POUCH RETRIEVAL ECOSAC 10MM (ENDOMECHANICALS) ×2
QUICK LOAD TK 5 (STAPLE)
RELOAD 45 VASCULAR/THIN (ENDOMECHANICALS) IMPLANT
RELOAD ENDO STITCH 2.0 (ENDOMECHANICALS) ×20
RELOAD STAPLE TA45 3.5 REG BLU (ENDOMECHANICALS) IMPLANT
RELOAD STAPLER BLUE 60MM (STAPLE) ×8 IMPLANT
RELOAD STAPLER GOLD 60MM (STAPLE) ×2 IMPLANT
RELOAD STAPLER WHITE 60MM (STAPLE) ×4 IMPLANT
SCISSORS LAP 5X35 DISP (ENDOMECHANICALS) ×4 IMPLANT
SCISSORS LAP 5X45 EPIX DISP (ENDOMECHANICALS) ×4 IMPLANT
SET CHOLANGIOGRAPH MIX (MISCELLANEOUS) IMPLANT
SET IRRIG TUBING LAPAROSCOPIC (IRRIGATION / IRRIGATOR) ×4 IMPLANT
SHEARS HARMONIC ACE PLUS 45CM (MISCELLANEOUS) ×4 IMPLANT
SLEEVE XCEL OPT CAN 5 100 (ENDOMECHANICALS) ×12 IMPLANT
SOLUTION ANTI FOG 6CC (MISCELLANEOUS) ×4 IMPLANT
SPONGE GAUZE 2X2 STER 10/PKG (GAUZE/BANDAGES/DRESSINGS)
STAPLER ECHELON BIOABSB 60 FLE (MISCELLANEOUS) IMPLANT
STAPLER ECHELON LONG 60 440 (INSTRUMENTS) ×4 IMPLANT
STAPLER RELOAD BLUE 60MM (STAPLE) ×16
STAPLER RELOAD GOLD 60MM (STAPLE) ×4
STAPLER RELOAD WHITE 60MM (STAPLE) ×8
STRIP CLOSURE SKIN 1/2X4 (GAUZE/BANDAGES/DRESSINGS) ×3 IMPLANT
SURGILUBE 2OZ TUBE FLIPTOP (MISCELLANEOUS) ×4 IMPLANT
SUT MNCRL AB 4-0 PS2 18 (SUTURE) ×4 IMPLANT
SUT RELOAD ENDO STITCH 2 48X1 (ENDOMECHANICALS) ×12
SUT RELOAD ENDO STITCH 2.0 (ENDOMECHANICALS) ×8
SUT SURGIDAC NAB ES-9 0 48 120 (SUTURE) IMPLANT
SUT VIC AB 0 UR5 27 (SUTURE) IMPLANT
SUT VIC AB 2-0 SH 27 (SUTURE) ×2
SUT VIC AB 2-0 SH 27X BRD (SUTURE) ×2 IMPLANT
SUT VICRYL 0 TIES 12 18 (SUTURE) IMPLANT
SUT VICRYL 0 UR6 27IN ABS (SUTURE) IMPLANT
SUTURE RELOAD END STTCH 2 48X1 (ENDOMECHANICALS) ×12 IMPLANT
SUTURE RELOAD ENDO STITCH 2.0 (ENDOMECHANICALS) ×8 IMPLANT
SYR 10ML ECCENTRIC (SYRINGE) ×4 IMPLANT
SYR 20CC LL (SYRINGE) ×8 IMPLANT
TIP RIGID 35CM EVICEL (HEMOSTASIS) ×4 IMPLANT
TOWEL OR 17X26 10 PK STRL BLUE (TOWEL DISPOSABLE) ×4 IMPLANT
TOWEL OR NON WOVEN STRL DISP B (DISPOSABLE) ×4 IMPLANT
TRAY FOLEY MTR SLVR 16FR STAT (SET/KITS/TRAYS/PACK) ×4 IMPLANT
TRAY LAPAROSCOPIC (CUSTOM PROCEDURE TRAY) ×4 IMPLANT
TROCAR BLADELESS OPT 5 100 (ENDOMECHANICALS) ×4 IMPLANT
TROCAR UNIVERSAL OPT 12M 100M (ENDOMECHANICALS) ×12 IMPLANT
TROCAR XCEL 12X100 BLDLESS (ENDOMECHANICALS) ×4 IMPLANT
TROCAR XCEL BLUNT TIP 100MML (ENDOMECHANICALS) IMPLANT
TROCAR XCEL NON-BLD 11X100MML (ENDOMECHANICALS) IMPLANT
TUBING CONNECTING 10 (TUBING) ×6 IMPLANT
TUBING CONNECTING 10' (TUBING) ×2
TUBING INSUF HEATED (TUBING) ×4 IMPLANT

## 2018-10-26 NOTE — Interval H&P Note (Signed)
History and Physical Interval Note:  10/26/2018 7:10 AM  Melysa Orson ApeDanielle Sahakian  has presented today for surgery, with the diagnosis of MORBID OBESITY  The various methods of treatment have been discussed with the patient and family. After consideration of risks, benefits and other options for treatment, the patient has consented to  Procedure(s): LAPAROSCOPIC ROUX-EN-Y GASTRIC BYPASS WITH UPPER ENDOSCOPY WITH ERAS PATHWAY (N/A) LAPAROSCOPIC CHOLECYSTECTOMY WITH INTRAOPERATIVE CHOLANGIOGRAM (N/A) as a surgical intervention .  The patient's history has been reviewed, patient examined, no change in status, stable for surgery.  I have reviewed the patient's chart and labs.  Questions were answered to the patient's satisfaction.    Pt desires cholecystectomy and  Understands if has bile leak or CBD injury that it increases overall risk of surgery  Mary SellaEric M. Andrey CampanileWilson, MD, FACS General, Bariatric, & Minimally Invasive Surgery [facility] [place]   Gaynelle AduEric Kendal Ghazarian

## 2018-10-26 NOTE — Anesthesia Postprocedure Evaluation (Signed)
Anesthesia Post Note  Patient: Craig GuessMonique Danielle Bakken  Procedure(s) Performed: LAPAROSCOPIC ROUX-EN-Y GASTRIC BYPASS WITH UPPER ENDOSCOPY WITH ERAS PATHWAY (N/A ) LAPAROSCOPIC CHOLECYSTECTOMY (N/A Abdomen)     Patient location during evaluation: PACU Anesthesia Type: General Level of consciousness: awake and alert Pain management: pain level controlled Vital Signs Assessment: post-procedure vital signs reviewed and stable Respiratory status: spontaneous breathing, nonlabored ventilation, respiratory function stable and patient connected to nasal cannula oxygen Cardiovascular status: blood pressure returned to baseline and stable Postop Assessment: no apparent nausea or vomiting Anesthetic complications: no    Last Vitals:  Vitals:   10/26/18 1411 10/26/18 1536  BP: 119/67 (!) 143/80  Pulse: 84 86  Resp: 18 18  Temp: 36.6 C 36.6 C  SpO2: 99% 97%    Last Pain:  Vitals:   10/26/18 1536  TempSrc: Oral  PainSc: 10-Worst pain ever                 Ajane Novella S

## 2018-10-26 NOTE — Op Note (Signed)
Victoria GuessMonique Danielle Silva [id number] September 27, 1981 10/26/2018  Preoperative diagnosis: gastric bypass in progress  Postoperative diagnosis: Same   Procedure: Upper endoscopy   Surgeon: Susy FrizzleMatt B. Daphine DeutscherMartin  M.D., FACS   Anesthesia: Gen.   Indications for procedure: This patient was undergoing a gastric bypass by Dr. Andrey CampanileWilson and endoscopy was needed to check the pouch for bleeding and leaks.    Description of procedure: The endoscope was placed in the mouth and into the oropharynx and under endoscopic vision it was advanced to the esophagogastric junction.  The pouch was insufflated and the anastomosis inspected..   No bleeding or leaks were detected.  The scope was withdrawn without difficulty.     Matt B. Daphine DeutscherMartin, MD, FACS General, Bariatric, & Minimally Invasive Surgery [facility] [place]

## 2018-10-26 NOTE — Progress Notes (Signed)
PHARMACY CONSULT FOR:  Risk Assessment for Post-Discharge VTE Following Bariatric Surgery  Post-Discharge VTE Risk Assessment: This patient's probability of 30-day post-discharge VTE is increased due to the factors marked:   Female    Age >/=60 years    BMI >/=50 kg/m2    CHF    Dyspnea at Rest    Paraplegia    Non-gastric-band surgery    Operation Time >/=3 hr    Return to OR     Length of Stay >/= 3 d   Predicted probability of 30-day post-discharge VTE: 0.16%  Other patient-specific factors to consider:  Recommendation for Discharge: No pharmacologic prophylaxis post-discharge  Victoria Silva is a 37 y.o. female who underwent  Roux-En-Y gastric bypass & Lap cholecystectomy  on 11/26   Case start: 0759 Case end: 1030  No Known Allergies  Patient Measurements: Height: 5' 1.5" (156.2 cm)(Simultaneous filing. User may not have seen previous data.) Weight: 233 lb (105.7 kg)(Simultaneous filing. User may not have seen previous data.) IBW/kg (Calculated) : 48.95 Body mass index is 43.31 kg/m.  Recent Labs    10/26/18 1110  HGB 13.2  HCT 39.3   Estimated Creatinine Clearance: 110 mL/min (by C-G formula based on SCr of 0.63 mg/dL).  Past Medical History:  Diagnosis Date  . Arthritis   . Asthma   . Bulging of cervical intervertebral disc   . Degenerative disc disease, cervical   . Fatty liver   . GERD (gastroesophageal reflux disease)   . Hypertension   . Migraine    1-2 MONTHLY   . Rash 10/14/2018   patient reports the following: onset 10-14-18, noticed painful burning, itching sensation to mid back; in mirror appeared red, cluster of bumps; did Evisit and was rx'd antiviral d/t suspicion of shingles ; no imprevmetn with antiviral , used clamnine lotion with releif of burning , pain and most of the redness; reports she has previously used heating pads to the area where the rash formed;  . Sleep apnea    NO DEVICE IN  USE    Medications Prior to Admission   Medication Sig Dispense Refill Last Dose  . BIOTIN PO Take 1 tablet by mouth daily.   Past Week at Unknown time  . bisoprolol-hydrochlorothiazide (ZIAC) 5-6.25 MG tablet Take 1 tablet by mouth daily.   11 10/25/2018 at Unknown time  . Cholecalciferol (VITAMIN D3 PO) Take 1 capsule by mouth daily.     Marland Kitchen. gabapentin (NEURONTIN) 300 MG capsule Take 300 mg by mouth 2 (two) times daily.   11 10/26/2018 at 0430  . glycopyrrolate (ROBINUL) 1 MG tablet Take 1 mg by mouth 2 (two) times daily.   5 Taking  . pantoprazole (PROTONIX) 40 MG tablet Take 40 mg by mouth daily.    10/26/2018 at 0430  . SUMAtriptan (IMITREX) 100 MG tablet Take 100 mg by mouth daily as needed for migraine.   6 Taking  . tiZANidine (ZANAFLEX) 4 MG tablet Take 4 mg by mouth every 8 (eight) hours as needed for muscle spasms.   5 Taking  . traMADol (ULTRAM) 50 MG tablet Take 50-100 mg by mouth 2 (two) times daily as needed for moderate pain.   5 Taking  . triamterene-hydrochlorothiazide (DYAZIDE) 37.5-25 MG capsule Take 1 capsule by mouth daily.    10/25/2018 at Unknown time   Otho BellowsGreen, Crysten Kaman L PharmD Pager [phone] 10/26/2018, 1:05 PM

## 2018-10-26 NOTE — Anesthesia Preprocedure Evaluation (Signed)
Anesthesia Evaluation  Patient identified by MRN, date of birth, ID band Patient awake    Reviewed: Allergy & Precautions, NPO status , Patient's Chart, lab work & pertinent test results  Airway Mallampati: II  TM Distance: >3 FB Neck ROM: Full    Dental no notable dental hx.    Pulmonary sleep apnea ,    Pulmonary exam normal breath sounds clear to auscultation       Cardiovascular hypertension, negative cardio ROS Normal cardiovascular exam Rhythm:Regular Rate:Normal     Neuro/Psych negative neurological ROS  negative psych ROS   GI/Hepatic negative GI ROS, Neg liver ROS,   Endo/Other  Morbid obesity  Renal/GU negative Renal ROS  negative genitourinary   Musculoskeletal negative musculoskeletal ROS (+)   Abdominal   Peds negative pediatric ROS (+)  Hematology negative hematology ROS (+)   Anesthesia Other Findings   Reproductive/Obstetrics negative OB ROS                             Anesthesia Physical Anesthesia Plan  ASA: III  Anesthesia Plan: General   Post-op Pain Management:    Induction: Intravenous  PONV Risk Score and Plan: 3 and Ondansetron, Dexamethasone, Treatment may vary due to age or medical condition and Scopolamine patch - Pre-op  Airway Management Planned: Oral ETT  Additional Equipment:   Intra-op Plan:   Post-operative Plan: Extubation in OR  Informed Consent: I have reviewed the patients History and Physical, chart, labs and discussed the procedure including the risks, benefits and alternatives for the proposed anesthesia with the patient or authorized representative who has indicated his/her understanding and acceptance.   Dental advisory given  Plan Discussed with: CRNA and Surgeon  Anesthesia Plan Comments:         Anesthesia Quick Evaluation

## 2018-10-26 NOTE — Progress Notes (Signed)
Pt started drinking first 2 oz cup of protein at 1731.

## 2018-10-26 NOTE — Transfer of Care (Signed)
Immediate Anesthesia Transfer of Care Note  Patient: Victoria Silva  Procedure(s) Performed: LAPAROSCOPIC ROUX-EN-Y GASTRIC BYPASS WITH UPPER ENDOSCOPY WITH ERAS PATHWAY (N/A ) LAPAROSCOPIC CHOLECYSTECTOMY (N/A Abdomen)  Patient Location: PACU  Anesthesia Type:General  Level of Consciousness: awake, alert  and oriented  Airway & Oxygen Therapy: Patient Spontanous Breathing and Patient connected to face mask oxygen  Post-op Assessment: Report given to RN  Post vital signs: Reviewed and stable  Last Vitals:  Vitals Value Taken Time  BP    Temp    Pulse 94 10/26/2018 10:43 AM  Resp 20 10/26/2018 10:43 AM  SpO2 99 % 10/26/2018 10:43 AM  Vitals shown include unvalidated device data.  Last Pain:  Vitals:   10/26/18 0558  TempSrc: Oral      Patients Stated Pain Goal: 4 (10/26/18 [id number])  Complications: No apparent anesthesia complications

## 2018-10-26 NOTE — Progress Notes (Signed)
Pt refused CPAP. She doesn't wear a CPAP at home and does not want to use it in the hospital. RT will continue to monitor.

## 2018-10-26 NOTE — Discharge Instructions (Signed)
° ° ° °GASTRIC BYPASS/SLEEVE ° Home Care Instructions ° ° These instructions are to help you care for yourself when you go home. ° °Call: If you have any problems. °• Call [phone] and ask for the surgeon on call °• If you need immediate help, come to the ER at [place].  °• Tell the ER staff that you are a new post-op gastric bypass or gastric sleeve patient °  °Signs and symptoms to report: • Severe vomiting or nausea °o If you cannot keep down clear liquids for longer than 1 day, call your surgeon  °• Abdominal pain that does not get better after taking your pain medication °• Fever over 100.4° F with chills °• Heart beating over 100 beats a minute °• Shortness of breath at rest °• Chest pain °•  Redness, swelling, drainage, or foul odor at incision (surgical) sites °•  If your incisions open or pull apart °• Swelling or pain in calf (lower leg) °• Diarrhea (Loose bowel movements that happen often), frequent watery, uncontrolled bowel movements °• Constipation, (no bowel movements for 3 days) if this happens: Pick one °o Milk of Magnesia, 2 tablespoons by mouth, 3 times a day for 2 days if needed °o Stop taking Milk of Magnesia once you have a bowel movement °o Call your doctor if constipation continues °Or °o Miralax  (instead of Milk of Magnesia) following the label instructions °o Stop taking Miralax once you have a bowel movement °o Call your doctor if constipation continues °• Anything you think is not normal °  °Normal side effects after surgery: • Unable to sleep at night or unable to focus °• Irritability or moody °• Being tearful (crying) or depressed °These are common complaints, possibly related to your anesthesia medications that put you to sleep, stress of surgery, and change in lifestyle.  This usually goes away a few weeks after surgery.  If these feelings continue, call your primary care doctor. °  °Wound Care: You may have surgical glue, steri-strips, or staples over your incisions after  surgery °• Surgical glue:  Looks like a clear film over your incisions and will wear off a little at a time °• Steri-strips: Strips of tape over your incisions. You may notice a yellowish color on the skin under the steri-strips. This is used to make the   steri-strips stick better. Do not pull the steri-strips off - let them fall off °• Staples: Staples may be removed before you leave the hospital °o If you go home with staples, call [facility], [phone] at for an appointment with your surgeon’s nurse to have staples removed 10 days after surgery. °• Showering: You may shower two (2) days after your surgery unless your surgeon tells you differently °o Wash gently around incisions with warm soapy water, rinse well, and gently pat dry  °o No tub baths until staples are removed, steri-strips fall off or glue is gone.  °  °Medications: • Medications should be liquid or crushed if larger than the size of a dime °• Extended release pills (medication that release a little bit at a time through the day) should NOT be crushed or cut. (examples include XL, ER, DR, SR) °• Depending on the size and number of medications you take, you may need to space (take a few throughout the day)/change the time you take your medications so that you do not over-fill your pouch (smaller stomach) °• Make sure you follow-up with your primary care doctor to   make medication changes needed during rapid weight loss and life-style changes °• If you have diabetes, follow up with the doctor that orders your diabetes medication(s) within one week after surgery and check your blood sugar regularly. °• Do not drive while taking prescription pain medication  °• It is ok to take Tylenol by the bottle instructions with your pain medicine or instead of your pain medicine as needed.  DO NOT TAKE NSAIDS (EXAMPLES OF NSAIDS:  IBUPROFREN/ NAPROXEN)  °Diet:                    First 2 Weeks ° You will see the dietician t about two (2) weeks  after your surgery. The dietician will increase the types of foods you can eat if you are handling liquids well: °• If you have severe vomiting or nausea and cannot keep down clear liquids lasting longer than 1 day, call your surgeon @ ([phone]) °Protein Shake °• Drink at least 2 ounces of shake 5-6 times per day °• Each serving of protein shakes (usually 8 - 12 ounces) should have: °o 15 grams of protein  °o And no more than 5 grams of carbohydrate  °• Goal for protein each day: °o Men = 80 grams per day °o Women = 60 grams per day °• Protein powder may be added to fluids such as non-fat milk or Lactaid milk or unsweetened Soy/Almond milk (limit to 35 grams added protein powder per serving) ° °Hydration °• Slowly increase the amount of water and other clear liquids as tolerated (See Acceptable Fluids) °• Slowly increase the amount of protein shake as tolerated  °•  Sip fluids slowly and throughout the day.  Do not use straws. °• May use sugar substitutes in small amounts (no more than 6 - 8 packets per day; i.e. Splenda) ° °Fluid Goal °• The first goal is to drink at least 8 ounces of protein shake/drink per day (or as directed by the nutritionist); some examples of protein shakes are Syntrax Nectar, Adkins Advantage, EAS Edge HP, and Unjury. See handout from pre-op Bariatric Education Class: °o Slowly increase the amount of protein shake you drink as tolerated °o You may find it easier to slowly sip shakes throughout the day °o It is important to get your proteins in first °• Your fluid goal is to drink 64 - 100 ounces of fluid daily °o It may take a few weeks to build up to this °• 32 oz (or more) should be clear liquids  °And  °• 32 oz (or more) should be full liquids (see below for examples) °• Liquids should not contain sugar, caffeine, or carbonation ° °Clear Liquids: °• Water or Sugar-free flavored water (i.e. Fruit H2O, Propel) °• Decaffeinated coffee or tea (sugar-free) °• Crystal Lite, Wyler’s Lite,  Minute Maid Lite °• Sugar-free Jell-O °• Bouillon or broth °• Sugar-free Popsicle:   *Less than 20 calories each; Limit 1 per day ° °Full Liquids: °Protein Shakes/Drinks + 2 choices per day of other full liquids °• Full liquids must be: °o No More Than 15 grams of Carbs per serving  °o No More Than 3 grams of Fat per serving °• Strained low-fat cream soup (except Cream of Potato or Tomato) °• Non-Fat milk °• Fat-free Lactaid Milk °• Unsweetened Soy Or Unsweetened Almond Milk °• Low Sugar yogurt (Dannon Lite & Fit, Greek yogurt; Oikos Triple Zero; Chobani Simply 100; Yoplait 100 calorie Greek - No Fruit on the Bottom) ° °  °Vitamins   and Minerals • Start 1 day after surgery unless otherwise directed by your surgeon °• 2 Chewable Bariatric Specific Multivitamin / Multimineral Supplement with iron (Example: Bariatric Advantage Multi EA) °• Chewable Calcium with Vitamin D-3 °(Example: 3 Chewable Calcium Plus 600 with Vitamin D-3) °o Take 500 mg three (3) times a day for a total of 1500 mg each day °o Do not take all 3 doses of calcium at one time as it may cause constipation, and you can only absorb 500 mg  at a time  °o Do not mix multivitamins containing iron with calcium supplements; take 2 hours apart °• Menstruating women and those with a history of anemia (a blood disease that causes weakness) may need extra iron °o Talk with your doctor to see if you need more iron °• Do not stop taking or change any vitamins or minerals until you talk to your dietitian or surgeon °• Your Dietitian and/or surgeon must approve all vitamin and mineral supplements °  °Activity and Exercise: Limit your physical activity as instructed by your doctor.  It is important to continue walking at home.  During this time, use these guidelines: °• Do not lift anything greater than ten (10) pounds for at least two (2) weeks °• Do not go back to work or drive until your surgeon says you can °• You may have sex when you feel comfortable  °o It is  VERY important for female patients to use a reliable birth control method; fertility often increases after surgery  °o All hormonal birth control will be ineffective for 30 days after surgery due to medications given during surgery a barrier method must be used. °o Do not get pregnant for at least 18 months °• Start exercising as soon as your doctor tells you that you can °o Make sure your doctor approves any physical activity °• Start with a simple walking program °• Walk 5-15 minutes each day, 7 days per week.  °• Slowly increase until you are walking 30-45 minutes per day °Consider joining our BELT program. [phone] or email [email or web address] °  °Special Instructions Things to remember: °• Use your CPAP when sleeping if this applies to you ° °• [facility] has two free Bariatric Surgery Support Groups that meet monthly °o The 3rd Thursday of each month, 6 pm, [employer] [facility]  °o The 2nd Friday of each month, 11:45 am in the private dining room in the basement of [place] °• It is very important to keep all follow up appointments with your surgeon, dietitian, primary care physician, and behavioral health practitioner °• Routine follow up schedule with your surgeon include appointments at 2-3 weeks, 6-8 weeks, 6 months, and 1 year at a minimum.  Your surgeon may request to see you more often.   °o After the first year, please follow up with your bariatric surgeon and dietitian at least once a year in order to maintain best weight loss results °[facility]: [phone] °[facility]: [phone] °Bariatric Nurse Coordinator: [phone] °  °   Reviewed and Endorsed  °by [facility] Patient Education Committee, June, 2016 °Edits Approved: Aug, 2018 ° ° ° °

## 2018-10-26 NOTE — Anesthesia Procedure Notes (Signed)
Procedure Name: Intubation Date/Time: 10/26/2018 7:35 AM Performed by: Niel Hummer, CRNA Pre-anesthesia Checklist: Patient identified, Emergency Drugs available, Suction available and Patient being monitored Patient Re-evaluated:Patient Re-evaluated prior to induction Oxygen Delivery Method: Circle system utilized Preoxygenation: Pre-oxygenation with 100% oxygen Induction Type: IV induction Ventilation: Mask ventilation without difficulty Laryngoscope Size: Mac and 4 Grade View: Grade I Tube type: Oral Tube size: 7.0 mm Number of attempts: 1 Airway Equipment and Method: Stylet Placement Confirmation: ETT inserted through vocal cords under direct vision,  positive ETCO2 and breath sounds checked- equal and bilateral Secured at: 22 cm Tube secured with: Tape Dental Injury: Teeth and Oropharynx as per pre-operative assessment

## 2018-10-26 NOTE — Progress Notes (Signed)
Patient finished last 2 oz cup of water at 2200 and started sips of protein at 2218.

## 2018-10-26 NOTE — Op Note (Signed)
Victoria Silva [id number] [birth year]. 10/26/2018  Preoperative diagnosis:    Morbid obesity (BMI 43)   Essential hypertension   Mild intermittent asthma without complication   Fatty liver   GERD (gastroesophageal reflux disease)   Prediabetes   Biliary dyskinesia  Postoperative  diagnosis:  1. same  Surgical procedure: Laparoscopic Roux-en-Y gastric bypass (ante-colic, ante-gastric); upper endoscopy; laparoscopic cholecystectomy  Surgeon: Atilano Ina, M.D. FACS  Asst.: Luretha Murphy MD FACS  Anesthesia: General plus exparel/marcaine mix  Complications: None   EBL: Minimal   Drains: None   Disposition: PACU in good condition   Indications for procedure: 37 y.o. yo female with morbid obesity who has been unsuccessful at sustained weight loss. The patient's comorbidities are listed above. We discussed the risk and benefits of surgery including but not limited to anesthesia risk, bleeding, infection, blood clot formation, anastomotic leak, anastomotic stricture, ulcer formation, death, respiratory complications, intestinal blockage, internal hernia, gallstone formation, vitamin and nutritional deficiencies, injury to surrounding structures, failure to lose weight and mood changes. She also has had intermittent episodes of colicky RUQ pain post-prandial and desired cholecystectomy.   Description of procedure: Patient is brought to the operating room and general anesthesia induced. The patient had received preoperative broad-spectrum IV antibiotics and subcutaneous heparin. The abdomen was widely sterilely prepped with Chloraprep and draped. Patient timeout was performed and correct patient and procedure confirmed. Access was obtained with a 12 mm Optiview trocar in the left upper quadrant and pneumoperitoneum established without difficulty. Under direct vision 12 mm trocars were placed laterally in the right upper quadrant, right upper quadrant midclavicular line, and to the  left and above the umbilicus for the camera port. A 5 mm trocar was placed laterally in the left upper quadrant.  Exparel/marcaine mix was infiltrated in bilateral lateral abdominal walls as a TAP block.  The omentum was brought into the upper abdomen and the transverse mesocolon elevated and the ligament of Treitz clearly identified. A 40 cm biliopancreatic limb was then carefully measured from the ligament of Treitz. The small intestine was divided at this point with a single firing of the white load linear stapler. A Penrose drain was sutured to the end of the Roux-en-Y limb for later identification. A 100 cm Roux-en-Y limb was then carefully measured. At this point a side-to-side anastomosis was created between the Roux limb and the end of the biliopancreatic limb. This was accomplished with a single firing of the 60 mm white load linear stapler. The common enterotomy was closed with a running 2-0 Vicryl begun at either end of the enterotomy and tied centrally. Eviceal tissue sealant was placed over the anastomosis. The mesenteric defect was then closed with running 2-0 silk. The omentum was then divided with the harmonic scalpel up towards the transverse colon to allow mobility of the Roux limb toward the gastric pouch. The patient was then placed in steep reversed Trendelenburg. Through a 5 mm subxiphoid site the [facility] retractor was placed and the left lobe of the liver elevated with excellent exposure of the upper stomach and hiatus. The angle of Hiss was then mobilized with the harmonic scalpel. A 4 cm gastric pouch was then carefully measured along the lesser curve of the stomach. Dissection was carried along the lesser curve at this point with the Harmonic scalpel working carefully back toward the lesser sac at right angles to the lesser curve. The free lesser sac was then entered. After being sure all tubes were removed from the stomach an initial  firing of the gold load 60 mm linear stapler was fired  at right angles across the lesser curve for about 4 cm. The gastric pouch was further mobilized posteriorly and then the pouch was completed with 3 further firings of the 60 mm blue load linear stapler up through the previously dissected angle of His. It was ensured that the pouch was completely mobilized away from the gastric remnant. This created a nice tubular 4-5 cm gastric pouch. The Roux limb was then brought up in an antecolic fashion with the candycane facing to the patient's left without undue tension. There was a 2cm serosal tear (not an unexpected occurrence) in the candy cane limb while retracting it up to the pouch -this was proximal to the anastomosis. The serosal tear was repaired three interrupted 2-0 vicryl endostitch. The gastrojejunostomy was created with an initial posterior row of 2-0 Vicryl between the Roux limb and the staple line of the gastric pouch. Enterotomies were then made in the gastric pouch and the Roux limb with the harmonic scalpel and at approximately 2-2-1/2 cm anastomosis was created with a single firing of the 60mm blue load linear stapler. The staple line was inspected and was intact without bleeding. The common enterotomy was then closed with running 2-0 Vicryl begun at either end and tied centrally. The Ewall tube was then easily passed through the anastomosis and an outer anterior layer of running 2-0 Vicryl was placed. The Ewald tube was removed. With the outlet of the gastrojejunostomy clamped and under saline irrigation the assistant performed upper endoscopy and with the gastric pouch tensely distended with air-there was no evidence of leak on this test. The pouch was desufflated. The Vonita Mosseterson defect was closed with running 2-0 silk. The Nathanson retractor was removed under direct vision after coating the anastomosis with Eviceal tissue sealant.  We then turned our attention to the cholecystectomy.   We positioned the patient in reverse Trendelenburg, tilted  slightly to the patient's left.  The gallbladder was identified, the fundus grasped and retracted cephalad. Adhesions were lysed bluntly and with the electrocautery where indicated, taking care not to injure any adjacent organs or viscus. The infundibulum was grasped and retracted laterally, exposing the peritoneum overlying the triangle of Calot. This was then divided and exposed in a blunt fashion. A critical view of the cystic duct and cystic artery was obtained.  The cystic duct was clearly identified and bluntly dissected circumferentially.    The cystic duct was then ligated with clips and divided. The cystic artery which had been identified & dissected free was ligated with clips and divided as well.   The gallbladder was dissected from the liver bed in retrograde fashion with the electrocautery. The gallbladder was removed and placed in an Ecco sac.  The gallbladder and Ecco sac were then removed through the LUQ port site. The liver bed was irrigated and inspected. Hemostasis was achieved with the electrocautery. Copious irrigation was utilized and was repeatedly aspirated until clear. PMI suture passer with a 0 vicryl was used to close the LUQ trocar site under laparoscopic guidance.      The abdomen was inspected for any evidence of bleeding or bowel injury and everything looked fine.  All CO2 was evacuated and trochars removed. Skin incisions were closed with 4-0 monocryl in a subcuticular fashion followed by benzoin, steri-strips and bandages. Sponge needle and instrument counts were correct. The patient was taken to the PACU in good condition.    Mary SellaEric M. Andrey CampanileWilson, MD, FACS  General, Bariatric, & Minimally Invasive Surgery [facility]

## 2018-10-26 NOTE — Progress Notes (Signed)
Discussed post op day goals with patient including ambulation, IS, diet progression, pain, and nausea control.  Questions answered. 

## 2018-10-27 ENCOUNTER — Encounter (HOSPITAL_COMMUNITY): Payer: Self-pay | Admitting: General Surgery

## 2018-10-27 LAB — CBC WITH DIFFERENTIAL/PLATELET
Abs Immature Granulocytes: 0.07 10*3/uL (ref 0.00–0.07)
BASOS PCT: 0 %
Basophils Absolute: 0 10*3/uL (ref 0.0–0.1)
EOS ABS: 0 10*3/uL (ref 0.0–0.5)
Eosinophils Relative: 0 %
HCT: 36.3 % (ref 36.0–46.0)
Hemoglobin: 12 g/dL (ref 12.0–15.0)
Immature Granulocytes: 1 %
Lymphocytes Relative: 19 %
Lymphs Abs: 2.9 10*3/uL (ref 0.7–4.0)
MCH: 30.2 pg (ref 26.0–34.0)
MCHC: 33.1 g/dL (ref 30.0–36.0)
MCV: 91.2 fL (ref 80.0–100.0)
MONOS PCT: 6 %
Monocytes Absolute: 0.8 10*3/uL (ref 0.1–1.0)
NEUTROS PCT: 74 %
NRBC: 0 % (ref 0.0–0.2)
Neutro Abs: 11 10*3/uL — ABNORMAL HIGH (ref 1.7–7.7)
PLATELETS: 260 10*3/uL (ref 150–400)
RBC: 3.98 MIL/uL (ref 3.87–5.11)
RDW: 13.1 % (ref 11.5–15.5)
WBC: 14.8 10*3/uL — AB (ref 4.0–10.5)

## 2018-10-27 LAB — COMPREHENSIVE METABOLIC PANEL
ALBUMIN: 3.6 g/dL (ref 3.5–5.0)
ALK PHOS: 50 U/L (ref 38–126)
ALT: 61 U/L — AB (ref 0–44)
ANION GAP: 7 (ref 5–15)
AST: 63 U/L — ABNORMAL HIGH (ref 15–41)
BUN: 11 mg/dL (ref 6–20)
CALCIUM: 8.6 mg/dL — AB (ref 8.9–10.3)
CO2: 27 mmol/L (ref 22–32)
CREATININE: 0.62 mg/dL (ref 0.44–1.00)
Chloride: 107 mmol/L (ref 98–111)
GFR calc Af Amer: >60 mL/min (ref >60)
GFR calc non Af Amer: >60 mL/min (ref >60)
GLUCOSE: 118 mg/dL — AB (ref 70–99)
Potassium: 3.8 mmol/L (ref 3.5–5.1)
SODIUM: 141 mmol/L (ref 135–145)
Total Bilirubin: 1.3 mg/dL — ABNORMAL HIGH (ref 0.3–1.2)
Total Protein: 6.7 g/dL (ref 6.5–8.1)

## 2018-10-27 MED ORDER — GABAPENTIN 300 MG PO CAPS: 300.0000 mg | ORAL_CAPSULE | Freq: Three times a day (TID) | ORAL | 0 refills | Status: DC

## 2018-10-27 MED ORDER — ONDANSETRON 4 MG PO TBDP: 4.0000 mg | ORAL_TABLET | Freq: Four times a day (QID) | ORAL | 0 refills | Status: DC | PRN

## 2018-10-27 MED ORDER — PANTOPRAZOLE SODIUM 40 MG PO TBEC: 40.0000 mg | DELAYED_RELEASE_TABLET | Freq: Every day | ORAL | 0 refills | Status: DC

## 2018-10-27 MED ORDER — OXYCODONE HCL 5 MG/5ML PO SOLN: 5.0000 mg | ORAL | 0 refills | Status: DC | PRN

## 2018-10-27 MED FILL — oxyCODONE HCL 5 MG/5ML SOLN: 5 | 3 days supply | Qty: 150 | Fill #0

## 2018-10-27 NOTE — Progress Notes (Signed)
Patient alert and oriented, pain is controlled. Patient is tolerating fluids, advanced to protein shake today, patient is tolerating well. Reviewed Gastric Bypass discharge instructions with patient and patient is able to articulate understanding. Provided information on BELT program, Support Group and [facility] outpatient pharmacy. All questions answered, will continue to monitor.   Total fluid intake 1650 Per hydration protocol call back one week post op

## 2018-10-27 NOTE — Plan of Care (Signed)

## 2018-10-27 NOTE — Discharge Summary (Signed)
Physician Discharge Summary  Patient ID: Victoria Silva MRN: [id number] DOB/AGE: 36-24-1982 36 y.o.  PCP: Marguarite Arbour, MD  Admit date: 10/26/2018 Discharge date: 10/27/2018  Admission Diagnoses:  Morbid obesity  Discharge Diagnoses:  same  Principal Problem:   Morbid obesity (HCC) Active Problems:   Essential hypertension   Mild intermittent asthma without complication   Fatty liver   GERD (gastroesophageal reflux disease)   Prediabetes   Biliary dyskinesia   Severe obesity (HCC)   Surgery:  Lap roux en Y gastric bypass and cholecystectomy  Discharged Condition: improved  Hospital Course:   Had surgery by Dr. Andrey Campanile.  Did well with liquids and ready to go home on PD 1  Consults: none  Significant Diagnostic Studies: none    Discharge Exam: Blood pressure 129/86, pulse 72, temperature 98.8 F (37.1 C), temperature source Oral, resp. rate 17, height 5' 1.5" (1.562 m), weight 105.7 kg, SpO2 96 %. Incisions OK  Disposition: Discharge disposition: 01-Home or Self Care       Discharge Instructions    Ambulate hourly while awake   Complete by:  As directed    Call MD for:  difficulty breathing, headache or visual disturbances   Complete by:  As directed    Call MD for:  persistant dizziness or light-headedness   Complete by:  As directed    Call MD for:  persistant nausea and vomiting   Complete by:  As directed    Call MD for:  redness, tenderness, or signs of infection (pain, swelling, redness, odor or green/yellow discharge around incision site)   Complete by:  As directed    Call MD for:  severe uncontrolled pain   Complete by:  As directed    Call MD for:  temperature >101 F   Complete by:  As directed    Diet bariatric full liquid   Complete by:  As directed    Incentive spirometry   Complete by:  As directed    Perform hourly while awake     Allergies as of 10/27/2018   No Known Allergies     Medication List    TAKE these  medications   BIOTIN PO Take 1 tablet by mouth daily.   bisoprolol-hydrochlorothiazide 5-6.25 MG tablet Commonly known as:  ZIAC Take 1 tablet by mouth daily. Notes to patient:  Monitor Blood Pressure Daily and keep a log for primary care physician.  Monitor for symptoms of dehydration.  You may need to make changes to your medications with rapid weight loss.     gabapentin 300 MG capsule Commonly known as:  NEURONTIN Take 300 mg by mouth 2 (two) times daily. What changed:  Another medication with the same name was added. Make sure you understand how and when to take each.   gabapentin 300 MG capsule Commonly known as:  NEURONTIN Take 1 capsule (300 mg total) by mouth every 8 (eight) hours. for 30 days What changed:  You were already taking a medication with the same name, and this prescription was added. Make sure you understand how and when to take each.   glycopyrrolate 1 MG tablet Commonly known as:  ROBINUL Take 1 mg by mouth 2 (two) times daily.   ondansetron 4 MG disintegrating tablet Commonly known as:  ZOFRAN-ODT Take 1 tablet (4 mg total) by mouth every 6 (six) hours as needed for nausea or vomiting.   oxyCODONE 5 MG/5ML solution Commonly known as:  ROXICODONE Take 5-10 mLs (5-10 mg total) by  mouth every 4 (four) hours as needed for moderate pain or severe pain.   pantoprazole 40 MG tablet Commonly known as:  PROTONIX Take 40 mg by mouth daily. What changed:  Another medication with the same name was added. Make sure you understand how and when to take each.   pantoprazole 40 MG tablet Commonly known as:  PROTONIX Take 1 tablet (40 mg total) by mouth daily. What changed:  You were already taking a medication with the same name, and this prescription was added. Make sure you understand how and when to take each.   SUMAtriptan 100 MG tablet Commonly known as:  IMITREX Take 100 mg by mouth daily as needed for migraine.   tiZANidine 4 MG tablet Commonly known as:   ZANAFLEX Take 4 mg by mouth every 8 (eight) hours as needed for muscle spasms.   traMADol 50 MG tablet Commonly known as:  ULTRAM Take 50-100 mg by mouth 2 (two) times daily as needed for moderate pain.   triamterene-hydrochlorothiazide 37.5-25 MG capsule Commonly known as:  DYAZIDE Take 1 capsule by mouth daily. Notes to patient:  Monitor Blood Pressure Daily and keep a log for primary care physician.  Monitor for symptoms of dehydration.  You may need to make changes to your medications with rapid weight loss.     VITAMIN D3 PO Take 1 capsule by mouth daily.      Follow-up Information    Gaynelle AduWilson, Eric, MD. Go on 11/17/2018.   Specialty:  General Surgery Why:  at 1015 Contact information: [address] [place] [address] [phone]        Gaynelle AduWilson, Eric, MD .   Specialty:  General Surgery Contact information: [address] [place] [address] [phone]           Signed: Valarie MerinoMatthew B Javonni Macke 10/27/2018, 11:55 AM

## 2018-10-27 NOTE — Progress Notes (Signed)
Patient alert and oriented, Post op day 1.  Provided support and encouragement.  Encouraged pulmonary toilet, ambulation and small sips of liquids. Completed 1290 ml of fluid including a protein shake.  All questions answered.  Will continue to monitor.

## 2018-10-27 NOTE — Progress Notes (Signed)
Discharge instructions given to pt and all questions were answered. Pt taken down via wheelchair and was picked up by her husband.  

## 2018-11-01 ENCOUNTER — Telehealth (HOSPITAL_COMMUNITY): Payer: Self-pay

## 2018-11-01 NOTE — Telephone Encounter (Signed)
Patient called to discuss post bariatric surgery follow up questions.  See below:   1.  Tell me about your pain and pain management?  Patient states she is having pulling type pain in her upper abdomen, burns at times.  She has been utilizing tylenol and pain medication from discharge. Notices this when lying in bed changing position helps but finds she can't lay on right side very long due to arm going numb from cervical disc issue.  No fever, no left shoulder pain, no nausea.  Would like pain med refilled, instructed to reach out to [facility]  2.  Let's talk about fluid intake.  How much total fluid are you taking in?45-55 ounces  3.  How much protein have you taken in the last 2 days?30 gram  4.  Have you had nausea?  Tell me about when have experienced nausea and what you did to help?no nausea  5.  Has the frequency or color changed with your urine?urinating frequently light in color  6.  Tell me what your incisions look like?no problems  7.  Have you been passing gas? BM?passing gas had bm  8.  If a problem or question were to arise who would you call?  Do you know contact numbers for [facility], [facility], and [facility]?aware of how to contact all services  9.  How has the walking going?walking frequently  10.  How are your vitamins and calcium going?  How are you taking them?mvi makes patient sick due to smell, she is taking by holding her nose.  Discussed alternatives.  Calcium no problem  Patient states she will contact [facility] about pain and renewal of pain medication

## 2018-11-08 ENCOUNTER — Encounter: Payer: 59 | Attending: General Surgery | Admitting: Dietician

## 2018-11-08 ENCOUNTER — Encounter: Payer: Self-pay | Admitting: Dietician

## 2018-11-08 VITALS — Ht 61.0 in | Wt 216.6 lb

## 2018-11-08 DIAGNOSIS — Z6841 Body Mass Index (BMI) 40.0 and over, adult: Secondary | ICD-10-CM | POA: Diagnosis not present

## 2018-11-08 DIAGNOSIS — R1010 Upper abdominal pain, unspecified: Secondary | ICD-10-CM | POA: Diagnosis not present

## 2018-11-08 DIAGNOSIS — I10 Essential (primary) hypertension: Secondary | ICD-10-CM | POA: Diagnosis not present

## 2018-11-08 NOTE — Progress Notes (Signed)
Follow-up visit:  2 Weeks Post-Operative Gastric Bypass Surgery  Medical Nutrition Therapy:  Appt start time: 1315 end time:  1400.  Primary concerns today: Post-operative Bariatric Surgery Nutrition Management.  Preferred Learning Style:   Visual   Learning Readiness:   Change in progress  Weight: 216.6lbs Height: 5'1" Weight at previous [facility] visit: 232.2lbs    Progress: Patient states she does not yet feel hungry, but is able to drink several ounces of fluid in a short period of time. She reports readiness to begin trying solid foods. She is experiencing some surgery-related pain; also had gallbladder removal in addition to the gastric bypass.   Dietary recall: Breakfast: Premier protein drink able to drink entire bottle in 30 minutes Snack: sugar free sports drinks, water  Lunch: light and fit yogurt, sugar free jello or popsicle, broth (does not like creamy soups) Snack: sips on drinks, same as am  Dinner: broth, tomato soup, or another protein drinks Snack: same as am  Fluid intake: 55-65oz daily Estimated total protein intake: 30-60grams.  Medications: acetaminophen prn, bisoprolol-hydrochlorothiazide, gabapentin, pantoprazole, SUMAtriptan prn, tiZANidine, traMADol prn, triamterene-hydrochlorothiazide Supplementation: bariatric chewable multivitamin 2x daily, calcium carbonate 3x daily  Using straws: few sips, not regularly Drinking while eating: no Hair loss: no Carbonated beverages: no N/V/D/C: none, other than temporarily when chewing multivitamin Dumping syndrome: no   Recent physical activity:  General walking, no structured exercise yet.   Progress Towards Goal(s):  In progress.  Handouts given during visit include:  Stage 3 and 4 bariatric diet   Nutritional Diagnosis:  Butler-3.3 Overweight/obesity As related to history of excess calories and inactivity.  As evidenced by patient with current BMI of 40.9, following bariatric diet for weight loss following  gastric bypass surgery.    Intervention:    Discussed patient's progress since surgery.  Instructed on diet advancement to include lean protein foods.   Advised patient to track protein intake and reduce intake of protein shakes as she increases protein from solid foods. Discussed options for enhancing protein content in foods with protein powder.    Discussed goal of losing body fat while preserving lean body weight.  Teaching Method Utilized:  Visual Auditory Hands on  Barriers to learning/adherence to lifestyle change: none  Demonstrated degree of understanding via:  Teach Back   Monitoring/Evaluation:  Dietary intake, exercise, and body weight. Follow up in 6 weeks for 2 month post-op visit.

## 2018-11-08 NOTE — Patient Instructions (Signed)
   Begin trying 1-2oz or 1/4 cup of tuna, eggs, and/or very lean ground meat, cooked moistly.   Keep up with protein intake daily and continue to work towards 60grams or more.   Decrease protein shakes as intake of solid proteins increases.

## 2018-11-15 ENCOUNTER — Other Ambulatory Visit: Payer: Self-pay | Admitting: *Deleted

## 2018-11-15 NOTE — Patient Outreach (Signed)
[facility]) Care Management  11/15/2018  Victoria GuessMonique Danielle Silva September 08, 1981 [id number]   Case transitioned to [place] MonJanet Hauser @ [facility] for Transition of care follow up.    Adrea Sherpa H. Gardiner Barefootooper RN, BSN, CCM [facility] Telephonic CM Phone: [phone] Fax: [phone]

## 2018-11-17 ENCOUNTER — Other Ambulatory Visit: Payer: Self-pay | Admitting: *Deleted

## 2018-11-17 NOTE — Patient Outreach (Signed)
[facility]) Care Management  11/17/2018  Victoria Silva 1981-08-26 [id number]   Subjective: Telephone call to patient's home / mobile number, spoke with patient, and HIPAA verified.  D[facility]ed [facility] Transition of care follow up, patient voiced understanding, and is in agreement to follow up.    Patient states her [facility], she is on her way to the hospital to see her mother, and requested call back.    Objective: Per KPN (Knowledge Performance Now, point of care tool) and chart review, patient hospitalized 10/26/18 - 10/27/18 for morbid obesity, status post Laparoscopic Roux-en-Y gastric bypass (ante-colic, ante-gastric); upper endoscopy; laparoscopic cholecystectomy on 10/26/18.   Patient also has a history of fatty liver, hypertension, prediabetes, Mild intermittent asthma, and Biliary dyskinesia.         Assessment: Received [facility] Preoperative / Transition of care referral on 10/13/18.   Transition of care follow up pending patient contact.        Plan: RNCM will send unsuccessful outreach  letter, [facility] pamph[facility], will call patient for 2nd telephone outreach attempt, transition of care follow up, and proceed with case closure, within 10 business days if no return call.        Joline Encalada H. Gardiner Barefootooper RN, BSN, CCM [facility] Care [facility] Telephonic CM Phone: [phone] Fax: [phone]

## 2018-11-18 ENCOUNTER — Other Ambulatory Visit: Payer: Self-pay | Admitting: General Surgery

## 2018-11-18 DIAGNOSIS — Z9884 Bariatric surgery status: Secondary | ICD-10-CM

## 2018-11-19 ENCOUNTER — Other Ambulatory Visit: Payer: 59 | Admitting: *Deleted

## 2018-11-19 ENCOUNTER — Ambulatory Visit
Admission: RE | Admit: 2018-11-19 | Discharge: 2018-11-19 | Disposition: A | Discharge status: Home / Self Care | Payer: 59 | Source: Ambulatory Visit | Attending: General Surgery | Admitting: General Surgery

## 2018-11-19 ENCOUNTER — Other Ambulatory Visit: Payer: Self-pay | Admitting: General Surgery

## 2018-11-19 DIAGNOSIS — Z9884 Bariatric surgery status: Secondary | ICD-10-CM

## 2018-11-19 DIAGNOSIS — R1012 Left upper quadrant pain: Secondary | ICD-10-CM | POA: Diagnosis not present

## 2018-11-19 MED ORDER — IOHEXOL 300 MG/ML  SOLN
100.0000 mL | Freq: Once | INTRAMUSCULAR | Status: AC | PRN
  Administered 2018-11-19: 100 mL via INTRAVENOUS

## 2018-11-19 NOTE — Patient Outreach (Signed)
[facility]) Care Management  11/19/2018  Victoria GuessMonique Danielle Silva 1981/08/26 [id number]   Subjective: Telephone call to patient's home / mobile number, spoke with patient, and HIPAA verified.  Discussed [facility] Care Management [facility] Transition of care follow up, patient voiced understanding, and is in agreement to follow up.    Patient states she remembers speaking with this RNCM in the past, is currently at hospital with mother, and requested call back at a later time.       Objective: Per KPN (Knowledge Performance Now, point of care tool) and chart review, patient hospitalized 10/26/18 - 10/27/18 for morbid obesity, status post Laparoscopic Roux-en-Y gastric bypass (ante-colic, ante-gastric); upper endoscopy; laparoscopic cholecystectomy on 10/26/18.   Patient also has a history of fatty liver, hypertension, prediabetes, Mild intermittent asthma, and Biliary dyskinesia.         Assessment: Received [facility] Preoperative / Transition of care referral on 10/13/18.   Transition of care follow up pending patient contact.        Plan: RNCM has sent unsuccessful outreach  letter, [facility] pamphlet, will call patient for 3rd telephone outreach attempt, transition of care follow up, and proceed with case closure, within 10 business days if no return call.       Victoria Silva H. Gardiner Barefootooper RN, BSN, CCM [facility] Care Management [facility] Telephonic CM Phone: [phone] Fax: [phone]

## 2018-11-25 ENCOUNTER — Other Ambulatory Visit: Payer: Self-pay | Admitting: *Deleted

## 2018-11-25 NOTE — Patient Outreach (Signed)
[facility]) Care Management  11/25/2018  Victoria GuessMonique Danielle Silva [birth year] [id number]   Subjective: Telephone call to patient's home  / mobile number, no answer, left HIPAA compliant voicemail message, and requested call back.     Objective:Per KPN (Knowledge Performance Now, point of care tool) and chart review,patient hospitalized 10/26/18 - 10/27/18 for morbid obesity, status postLaparoscopic Roux-en-Y gastric bypass (ante-colic, ante-gastric); upper endoscopy; laparoscopic cholecystectomyon 10/26/18. Patient also has a history of fatty liver, hypertension, prediabetes, Mild intermittent asthma, andBiliary dyskinesia.      Assessment: Received [facility] Preoperative / Transition of care referral on 10/13/18.Transition of care follow up pending patient contact.       Plan:RNCM has sent unsuccessful outreach letter, [facility] pamphlet, and will proceed with case closure, within 10 business days if no return call.       Monroe Toure H. Gardiner Barefootooper RN, BSN, CCM [facility] Care Management [facility] Telephonic CM Phone: [phone] Fax: [phone]

## 2018-11-26 ENCOUNTER — Other Ambulatory Visit: Payer: Self-pay | Admitting: *Deleted

## 2018-11-26 NOTE — Patient Outreach (Signed)
[facility] Care Management  11/26/2018  Victoria GuessMonique Danielle Silva Jul 23, 1981 [id number]   No response from patient outreach attempts will proceed with case closure.     Objective:Per KPN (Knowledge Performance Now, point of care tool) and chart review,patient hospitalized 10/26/18 - 10/27/18 for morbid obesity, status postLaparoscopic Roux-en-Y gastric bypass (ante-colic, ante-gastric); upper endoscopy; laparoscopic cholecystectomyon 10/26/18. Patient also has a history of fatty liver, hypertension, prediabetes, Mild intermittent asthma, andBiliary dyskinesia.       Assessment: Received UMR Preoperative / Transition of care referral on 10/13/18.Transition of care follow up not completed due to unable to contact patient and will proceed with case closure.       Plan:Case closure due to unable to reach.        Victoria Lumley H. Gardiner Barefootooper RN, BSN, CCM [facility] Care [facility] Telephonic CM Phone: [phone] Fax: [phone]

## 2018-12-31 ENCOUNTER — Telehealth: Payer: Self-pay | Admitting: Dietician

## 2018-12-31 NOTE — Telephone Encounter (Signed)
Called patient regarding likely scheduling mix-up; she is due for 7664-month post-op RD visit. Offered several available times in the next week, and requested a call back.

## 2019-01-14 ENCOUNTER — Encounter: Payer: Self-pay | Admitting: Dietician

## 2019-01-14 NOTE — Progress Notes (Signed)
Have not heard back from patient to reschedule her appointment from 12/31/18. Sent letter to referring provider.

## 2019-01-28 DIAGNOSIS — I10 Essential (primary) hypertension: Secondary | ICD-10-CM | POA: Diagnosis not present

## 2019-01-28 DIAGNOSIS — G43719 Chronic migraine without aura, intractable, without status migrainosus: Secondary | ICD-10-CM | POA: Diagnosis not present

## 2019-01-28 DIAGNOSIS — E282 Polycystic ovarian syndrome: Secondary | ICD-10-CM | POA: Diagnosis not present

## 2019-01-28 DIAGNOSIS — Z0000 Encounter for general adult medical examination without abnormal findings: Secondary | ICD-10-CM | POA: Diagnosis not present

## 2019-01-31 DIAGNOSIS — Z79899 Other long term (current) drug therapy: Secondary | ICD-10-CM | POA: Diagnosis not present

## 2019-01-31 DIAGNOSIS — Z9884 Bariatric surgery status: Secondary | ICD-10-CM | POA: Diagnosis not present

## 2019-01-31 DIAGNOSIS — E7800 Pure hypercholesterolemia, unspecified: Secondary | ICD-10-CM | POA: Diagnosis not present

## 2019-01-31 DIAGNOSIS — I10 Essential (primary) hypertension: Secondary | ICD-10-CM | POA: Diagnosis not present

## 2019-03-08 DIAGNOSIS — M79605 Pain in left leg: Secondary | ICD-10-CM | POA: Diagnosis not present

## 2019-03-08 DIAGNOSIS — M6281 Muscle weakness (generalized): Secondary | ICD-10-CM | POA: Diagnosis not present

## 2019-03-08 DIAGNOSIS — Z90711 Acquired absence of uterus with remaining cervical stump: Secondary | ICD-10-CM | POA: Diagnosis not present

## 2019-03-08 DIAGNOSIS — N898 Other specified noninflammatory disorders of vagina: Secondary | ICD-10-CM | POA: Diagnosis not present

## 2019-03-08 DIAGNOSIS — M545 Low back pain: Secondary | ICD-10-CM | POA: Diagnosis not present

## 2019-04-17 ENCOUNTER — Telehealth: Payer: 59 | Admitting: Family

## 2019-04-17 DIAGNOSIS — R399 Unspecified symptoms and signs involving the genitourinary system: Secondary | ICD-10-CM

## 2019-04-17 MED ORDER — FLUCONAZOLE 150 MG PO TABS: 150.0000 mg | ORAL_TABLET | Freq: Once | ORAL | 0 refills | Status: AC

## 2019-04-17 MED ORDER — CEPHALEXIN 500 MG PO CAPS: 500.0000 mg | ORAL_CAPSULE | Freq: Two times a day (BID) | ORAL | 0 refills | Status: DC

## 2019-04-17 NOTE — Progress Notes (Signed)
We are sorry that you are not feeling well.  Here is how we plan to help!  Based on what you shared with me it looks like you most likely have a simple urinary tract infection.  A UTI (Urinary Tract Infection) is a bacterial infection of the bladder.  Most cases of urinary tract infections are simple to treat but a key part of your care is to encourage you to drink plenty of fluids and watch your symptoms carefully.  I have prescribed Keflex 500 mg twice a day for 7 days.  Your symptoms should gradually improve. Call us if the burning in your urine worsens, you develop worsening fever, back pain or pelvic pain or if your symptoms do not resolve after completing the antibiotic.  I have  also sent in a Diflucan Prescription sent to your pharmacy.  Approximately 5 minutes was spent documenting and reviewing patient's chart.    Urinary tract infections can be prevented by drinking plenty of water to keep your body hydrated.  Also be sure when you wipe, wipe from front to back and don't hold it in!  If possible, empty your bladder every 4 hours.  Your e-visit answers were reviewed by a board certified advanced clinical practitioner to complete your personal care plan.  Depending on the condition, your plan could have included both over the counter or prescription medications.  If there is a problem please reply  once you have received a response from your provider.  Your safety is important to [place].  If you have drug allergies check your prescription carefully.    You can use MyChart to ask questions about today's visit, request a non-urgent call back, or ask for a work or school excuse for 24 hours related to this e-Visit. If it has been greater than 24 hours you will need to follow up with your provider, or enter a new e-Visit to address those concerns.   You will get an e-mail in the next two days asking about your experience.  I hope that your e-visit has been valuable and will speed your  recovery. Thank you for using e-visits.

## 2019-04-29 DIAGNOSIS — M5030 Other cervical disc degeneration, unspecified cervical region: Secondary | ICD-10-CM | POA: Diagnosis not present

## 2019-04-29 DIAGNOSIS — M62838 Other muscle spasm: Secondary | ICD-10-CM | POA: Diagnosis not present

## 2019-04-29 DIAGNOSIS — M5412 Radiculopathy, cervical region: Secondary | ICD-10-CM | POA: Diagnosis not present

## 2019-05-13 DIAGNOSIS — I10 Essential (primary) hypertension: Secondary | ICD-10-CM | POA: Diagnosis not present

## 2019-05-13 DIAGNOSIS — R946 Abnormal results of thyroid function studies: Secondary | ICD-10-CM | POA: Diagnosis not present

## 2019-06-02 DIAGNOSIS — Z9884 Bariatric surgery status: Secondary | ICD-10-CM | POA: Diagnosis not present

## 2019-06-02 DIAGNOSIS — E663 Overweight: Secondary | ICD-10-CM | POA: Diagnosis not present

## 2019-07-22 DIAGNOSIS — Z79899 Other long term (current) drug therapy: Secondary | ICD-10-CM | POA: Diagnosis not present

## 2019-07-22 DIAGNOSIS — I10 Essential (primary) hypertension: Secondary | ICD-10-CM | POA: Diagnosis not present

## 2019-07-22 DIAGNOSIS — Z9884 Bariatric surgery status: Secondary | ICD-10-CM | POA: Diagnosis not present

## 2019-07-22 DIAGNOSIS — G43719 Chronic migraine without aura, intractable, without status migrainosus: Secondary | ICD-10-CM | POA: Diagnosis not present

## 2019-09-16 DIAGNOSIS — Z9884 Bariatric surgery status: Secondary | ICD-10-CM | POA: Diagnosis not present

## 2019-09-16 DIAGNOSIS — Z79899 Other long term (current) drug therapy: Secondary | ICD-10-CM | POA: Diagnosis not present

## 2019-11-23 DIAGNOSIS — R945 Abnormal results of liver function studies: Secondary | ICD-10-CM | POA: Diagnosis not present

## 2019-12-08 DIAGNOSIS — Z0110 Encounter for examination of ears and hearing without abnormal findings: Secondary | ICD-10-CM | POA: Diagnosis not present

## 2019-12-23 DIAGNOSIS — M5030 Other cervical disc degeneration, unspecified cervical region: Secondary | ICD-10-CM | POA: Diagnosis not present

## 2019-12-23 DIAGNOSIS — M62838 Other muscle spasm: Secondary | ICD-10-CM | POA: Diagnosis not present

## 2019-12-23 DIAGNOSIS — M7551 Bursitis of right shoulder: Secondary | ICD-10-CM | POA: Diagnosis not present

## 2019-12-23 DIAGNOSIS — M5412 Radiculopathy, cervical region: Secondary | ICD-10-CM | POA: Diagnosis not present

## 2020-03-01 ENCOUNTER — Other Ambulatory Visit: Payer: Self-pay | Admitting: Internal Medicine

## 2020-03-01 DIAGNOSIS — Z9884 Bariatric surgery status: Secondary | ICD-10-CM | POA: Diagnosis not present

## 2020-03-01 DIAGNOSIS — G43719 Chronic migraine without aura, intractable, without status migrainosus: Secondary | ICD-10-CM | POA: Diagnosis not present

## 2020-03-01 DIAGNOSIS — E282 Polycystic ovarian syndrome: Secondary | ICD-10-CM | POA: Diagnosis not present

## 2020-03-01 DIAGNOSIS — Z79899 Other long term (current) drug therapy: Secondary | ICD-10-CM | POA: Diagnosis not present

## 2020-03-01 DIAGNOSIS — I10 Essential (primary) hypertension: Secondary | ICD-10-CM | POA: Diagnosis not present

## 2020-03-01 DIAGNOSIS — M25511 Pain in right shoulder: Secondary | ICD-10-CM | POA: Diagnosis not present

## 2020-03-29 DIAGNOSIS — B078 Other viral warts: Secondary | ICD-10-CM | POA: Diagnosis not present

## 2020-05-23 DIAGNOSIS — G8929 Other chronic pain: Secondary | ICD-10-CM | POA: Diagnosis not present

## 2020-05-23 DIAGNOSIS — M25511 Pain in right shoulder: Secondary | ICD-10-CM | POA: Diagnosis not present

## 2020-05-24 ENCOUNTER — Encounter (HOSPITAL_COMMUNITY): Payer: Self-pay

## 2020-06-20 DIAGNOSIS — M5412 Radiculopathy, cervical region: Secondary | ICD-10-CM | POA: Diagnosis not present

## 2020-06-20 DIAGNOSIS — M62838 Other muscle spasm: Secondary | ICD-10-CM | POA: Diagnosis not present

## 2020-06-20 DIAGNOSIS — M7581 Other shoulder lesions, right shoulder: Secondary | ICD-10-CM | POA: Diagnosis not present

## 2020-06-20 DIAGNOSIS — S43431A Superior glenoid labrum lesion of right shoulder, initial encounter: Secondary | ICD-10-CM | POA: Diagnosis not present

## 2020-06-20 DIAGNOSIS — M5030 Other cervical disc degeneration, unspecified cervical region: Secondary | ICD-10-CM | POA: Diagnosis not present

## 2020-06-20 DIAGNOSIS — M7551 Bursitis of right shoulder: Secondary | ICD-10-CM | POA: Diagnosis not present

## 2020-06-22 ENCOUNTER — Other Ambulatory Visit: Payer: Self-pay | Admitting: Surgery

## 2020-06-22 ENCOUNTER — Other Ambulatory Visit (HOSPITAL_COMMUNITY): Payer: Self-pay | Admitting: Surgery

## 2020-06-22 DIAGNOSIS — M7581 Other shoulder lesions, right shoulder: Secondary | ICD-10-CM

## 2020-06-22 DIAGNOSIS — S43431A Superior glenoid labrum lesion of right shoulder, initial encounter: Secondary | ICD-10-CM

## 2020-07-11 ENCOUNTER — Ambulatory Visit: Payer: 59

## 2020-07-20 ENCOUNTER — Ambulatory Visit
Admission: RE | Admit: 2020-07-20 | Discharge: 2020-07-20 | Disposition: A | Discharge status: Home / Self Care | Payer: 59 | Source: Ambulatory Visit | Attending: Surgery | Admitting: Surgery

## 2020-07-20 ENCOUNTER — Other Ambulatory Visit: Payer: Self-pay

## 2020-07-20 DIAGNOSIS — X58XXXA Exposure to other specified factors, initial encounter: Secondary | ICD-10-CM | POA: Insufficient documentation

## 2020-07-20 DIAGNOSIS — M19011 Primary osteoarthritis, right shoulder: Secondary | ICD-10-CM | POA: Diagnosis not present

## 2020-07-20 DIAGNOSIS — S43431A Superior glenoid labrum lesion of right shoulder, initial encounter: Secondary | ICD-10-CM | POA: Insufficient documentation

## 2020-07-20 DIAGNOSIS — M7581 Other shoulder lesions, right shoulder: Secondary | ICD-10-CM | POA: Insufficient documentation

## 2020-07-20 DIAGNOSIS — M67813 Other specified disorders of tendon, right shoulder: Secondary | ICD-10-CM | POA: Diagnosis not present

## 2020-07-20 DIAGNOSIS — M25711 Osteophyte, right shoulder: Secondary | ICD-10-CM | POA: Diagnosis not present

## 2020-07-20 MED ORDER — LIDOCAINE HCL (PF) 1 % IJ SOLN
5.0000 mL | Freq: Once | INTRAMUSCULAR | Status: AC
  Administered 2020-07-20: 5 mL
  Filled 2020-07-20: qty 5

## 2020-07-20 MED ORDER — SODIUM CHLORIDE (PF) 0.9 % IJ SOLN
5.0000 mL | Freq: Once | INTRAMUSCULAR | Status: AC
  Administered 2020-07-20: 5 mL

## 2020-07-20 MED ORDER — IOHEXOL 180 MG/ML  SOLN
15.0000 mL | Freq: Once | INTRAMUSCULAR | Status: AC | PRN
  Administered 2020-07-20: 15 mL via INTRA_ARTICULAR

## 2020-07-27 DIAGNOSIS — M7581 Other shoulder lesions, right shoulder: Secondary | ICD-10-CM | POA: Diagnosis not present

## 2020-07-30 ENCOUNTER — Telehealth: Payer: 59 | Admitting: Emergency Medicine

## 2020-07-30 DIAGNOSIS — R300 Dysuria: Secondary | ICD-10-CM

## 2020-07-30 MED ORDER — FLUCONAZOLE 150 MG PO TABS: 150.0000 mg | ORAL_TABLET | Freq: Once | ORAL | 0 refills | Status: AC

## 2020-07-30 MED ORDER — NITROFURANTOIN MONOHYD MACRO 100 MG PO CAPS: 100.0000 mg | ORAL_CAPSULE | Freq: Two times a day (BID) | ORAL | 0 refills | Status: DC

## 2020-07-30 NOTE — Progress Notes (Signed)
We are sorry that you are not feeling well.  Here is how we plan to help!  Based on what you shared with me it looks like you most likely have a simple urinary tract infection.  A UTI (Urinary Tract Infection) is a bacterial infection of the bladder.  Most cases of urinary tract infections are simple to treat but a key part of your care is to encourage you to drink plenty of fluids and watch your symptoms carefully.  I have prescribed MacroBid 100 mg twice a day for 5 days. I have also prescribed Diflucan 150 mg once. Your symptoms should gradually improve. Call us if the burning in your urine worsens, you develop worsening fever, back pain or pelvic pain or if your symptoms do not resolve after completing the antibiotic.  Urinary tract infections can be prevented by drinking plenty of water to keep your body hydrated.  Also be sure when you wipe, wipe from front to back and don't hold it in!  If possible, empty your bladder every 4 hours.  Your e-visit answers were reviewed by a board certified advanced clinical practitioner to complete your personal care plan.  Depending on the condition, your plan could have included both over the counter or prescription medications.  If there is a problem please reply  once you have received a response from your provider.  Your safety is important to [place].  If you have drug allergies check your prescription carefully.    You can use MyChart to ask questions about today's visit, request a non-urgent call back, or ask for a work or school excuse for 24 hours related to this e-Visit. If it has been greater than 24 hours you will need to follow up with your provider, or enter a new e-Visit to address those concerns.   You will get an e-mail in the next two days asking about your experience.  I hope that your e-visit has been valuable and will speed your recovery. Thank you for using e-visits.   **Please do not respond to this message unless you have follow up  questions.** Greater than 5 but less than 10 minutes spent researching, coordinating, and implementing care for this patient today

## 2020-08-19 ENCOUNTER — Telehealth: Payer: 59 | Admitting: Physician Assistant

## 2020-08-19 DIAGNOSIS — M549 Dorsalgia, unspecified: Secondary | ICD-10-CM | POA: Diagnosis not present

## 2020-08-19 MED ORDER — METHYLPREDNISOLONE 4 MG PO TBPK: ORAL_TABLET | ORAL | 0 refills | Status: DC

## 2020-08-19 NOTE — Progress Notes (Signed)
We are sorry that you are not feeling well.  Here is how we plan to help!  Based on what you have shared with me it looks like you mostly have acute back pain.  Acute back pain is defined as musculoskeletal pain that can resolve in 1-3 weeks with conservative treatment.  I have prescribed an oral prednisone taper.  Some patients experience stomach irritation or in increased heartburn with anti-inflammatory drugs.  Please keep in mind that muscle relaxer's can cause fatigue and should not be taken while at work or driving.  Back pain is very common.  The pain often gets better over time.  The cause of back pain is usually not dangerous.  Most people can learn to manage their back pain on their own.  Home Care  Stay active.  Start with short walks on flat ground if you can.  Try to walk farther each day.  Do not sit, drive or stand in one place for more than 30 minutes.  Do not stay in bed.  Do not avoid exercise or work.  Activity can help your back heal faster.  Be careful when you bend or lift an object.  Bend at your knees, keep the object close to you, and do not twist.  Sleep on a firm mattress.  Lie on your side, and bend your knees.  If you lie on your back, put a pillow under your knees.  Only take medicines as told by your doctor.  Put ice on the injured area.  Put ice in a plastic bag  Place a towel between your skin and the bag  Leave the ice on for 15-20 minutes, 3-4 times a day for the first 2-3 days. 210 After that, you can switch between ice and heat packs.  Ask your doctor about back exercises or massage.  Avoid feeling anxious or stressed.  Find good ways to deal with stress, such as exercise.  Get Help Right Way If:  Your pain does not go away with rest or medicine.  Your pain does not go away in 1 week.  You have new problems.  You do not feel well.  The pain spreads into your legs.  You cannot control when you poop (bowel movement) or pee  (urinate)  You feel sick to your stomach (nauseous) or throw up (vomit)  You have belly (abdominal) pain.  You feel like you may pass out (faint).  If you develop a fever.  Make Sure you:  Understand these instructions.  Will watch your condition  Will get help right away if you are not doing well or get worse.   Greater than 5 minutes, yet less than 10 minutes of time have been spent researching, coordinating, and implementing care for this patient today.  Jarold Motto PA-C

## 2020-08-31 ENCOUNTER — Other Ambulatory Visit: Payer: Self-pay | Admitting: Internal Medicine

## 2020-08-31 DIAGNOSIS — I10 Essential (primary) hypertension: Secondary | ICD-10-CM | POA: Diagnosis not present

## 2020-08-31 DIAGNOSIS — Z9884 Bariatric surgery status: Secondary | ICD-10-CM | POA: Diagnosis not present

## 2020-08-31 DIAGNOSIS — Z79899 Other long term (current) drug therapy: Secondary | ICD-10-CM | POA: Diagnosis not present

## 2020-09-13 ENCOUNTER — Other Ambulatory Visit: Payer: Self-pay | Admitting: Internal Medicine

## 2020-09-21 ENCOUNTER — Other Ambulatory Visit: Payer: Self-pay | Admitting: Internal Medicine

## 2020-09-25 ENCOUNTER — Other Ambulatory Visit: Payer: Self-pay | Admitting: Internal Medicine

## 2020-10-11 DIAGNOSIS — Z09 Encounter for follow-up examination after completed treatment for conditions other than malignant neoplasm: Secondary | ICD-10-CM | POA: Diagnosis not present

## 2020-10-11 DIAGNOSIS — Z8639 Personal history of other endocrine, nutritional and metabolic disease: Secondary | ICD-10-CM | POA: Diagnosis not present

## 2020-10-11 DIAGNOSIS — Z8679 Personal history of other diseases of the circulatory system: Secondary | ICD-10-CM | POA: Diagnosis not present

## 2020-10-11 DIAGNOSIS — Z8669 Personal history of other diseases of the nervous system and sense organs: Secondary | ICD-10-CM | POA: Diagnosis not present

## 2020-10-11 DIAGNOSIS — Z87898 Personal history of other specified conditions: Secondary | ICD-10-CM | POA: Diagnosis not present

## 2020-10-11 DIAGNOSIS — G43809 Other migraine, not intractable, without status migrainosus: Secondary | ICD-10-CM | POA: Diagnosis not present

## 2020-10-19 ENCOUNTER — Other Ambulatory Visit: Payer: Self-pay | Admitting: Internal Medicine

## 2020-11-19 ENCOUNTER — Other Ambulatory Visit: Payer: Self-pay | Admitting: Internal Medicine

## 2020-11-26 ENCOUNTER — Other Ambulatory Visit: Payer: Self-pay | Admitting: Nurse Practitioner

## 2020-11-26 ENCOUNTER — Telehealth: Payer: 59 | Admitting: Nurse Practitioner

## 2020-11-26 ENCOUNTER — Ambulatory Visit
Admission: EM | Admit: 2020-11-26 | Discharge: 2020-11-26 | Disposition: A | Discharge status: Home / Self Care | Payer: 59 | Attending: Family Medicine | Admitting: Family Medicine

## 2020-11-26 ENCOUNTER — Other Ambulatory Visit: Payer: Self-pay

## 2020-11-26 DIAGNOSIS — R059 Cough, unspecified: Secondary | ICD-10-CM | POA: Diagnosis not present

## 2020-11-26 DIAGNOSIS — U071 COVID-19: Secondary | ICD-10-CM | POA: Diagnosis not present

## 2020-11-26 LAB — RESP PANEL BY RT-PCR (FLU A&B, COVID) ARPGX2
Influenza A by PCR: NEGATIVE
Influenza B by PCR: NEGATIVE
SARS Coronavirus 2 by RT PCR: POSITIVE — AB

## 2020-11-26 MED ORDER — PREDNISONE 50 MG PO TABS: ORAL_TABLET | ORAL | 0 refills | Status: DC

## 2020-11-26 MED ORDER — BENZONATATE 100 MG PO CAPS: 100.0000 mg | ORAL_CAPSULE | Freq: Three times a day (TID) | ORAL | 0 refills | Status: DC | PRN

## 2020-11-26 MED ORDER — OSELTAMIVIR PHOSPHATE 75 MG PO CAPS: 75.0000 mg | ORAL_CAPSULE | Freq: Two times a day (BID) | ORAL | 0 refills | Status: DC

## 2020-11-26 NOTE — ED Triage Notes (Signed)
Patient states that she started feeling bad yesterday but woke up this morning with body aches, cough and fevers. 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

## 2020-11-26 NOTE — Progress Notes (Signed)

## 2020-11-26 NOTE — ED Provider Notes (Signed)
[facility]    CSN: [id number] Arrival date & time: 11/26/20  1855      History   Chief Complaint Chief Complaint  Patient presents with   Generalized Body Aches   HPI   39 year old female presents with respiratory symptoms.   Symptoms started yesterday. Works in the ER.  She reports sore throat, hoarseness, cough, body aches, and has had a fever as well.  She reports that her fever has been as high as 104 with a forehead thermometer.  Currently afebrile.  Has taken some Tylenol and NSAIDs with improvement in fever.  Patient is concerned that she may have fluid Covid.  Desires testing today.  No other reported symptoms.  No other complaints.  Past Medical History:  Diagnosis Date   Arthritis    Asthma    Bulging of cervical intervertebral disc    Degenerative disc disease, cervical    Fatty liver    GERD (gastroesophageal reflux disease)    Hypertension    Migraine    1-2 MONTHLY    Rash 10/14/2018   patient reports the following: onset 10-14-18, noticed painful burning, itching sensation to mid back; in mirror appeared red, cluster of bumps; did Evisit and was rx'd antiviral d/t suspicion of shingles ; no imprevmetn with antiviral , used clamnine lotion with releif of burning , pain and most of the redness; reports she has previously used heating pads to the area where the rash formed;   Sleep apnea    NO DEVICE IN  USE     Patient Active Problem List   Diagnosis Date Noted   GERD (gastroesophageal reflux disease) 10/26/2018   Prediabetes 10/26/2018   Biliary dyskinesia 10/26/2018   Severe obesity (HCC) 10/26/2018   Essential hypertension 02/05/2017   Morbid obesity (HCC) 02/05/2017   Elevated liver enzymes 02/05/2017   Chronic abdominal pain 02/05/2017   Mild intermittent asthma without complication 02/05/2017   Polycystic ovarian syndrome 02/05/2017   Fatty liver 02/05/2017    Past Surgical History:  Procedure Laterality Date    ABDOMINAL HYSTERECTOMY  2008   uterus removed only   CESAREAN SECTION     X3   CHOLECYSTECTOMY N/A 10/26/2018   Procedure: LAPAROSCOPIC CHOLECYSTECTOMY;  Surgeon: Gaynelle Adu, MD;  Location: [facility];  Service: General;  Laterality: N/A;   ESOPHAGOGASTRODUODENOSCOPY (EGD) WITH PROPOFOL N/A 06/05/2017   Procedure: ESOPHAGOGASTRODUODENOSCOPY (EGD) WITH PROPOFOL;  Surgeon: Scot Jun, MD;  Location: [facility];  Service: Endoscopy;  Laterality: N/A;   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      Home Medications    Prior to Admission medications   Medication Sig Start Date End Date Taking? Authorizing Provider  acetaminophen (TYLENOL) 500 MG tablet Take by mouth.   Yes [provider]  BIOTIN PO Take 1 tablet by mouth daily.   Yes [provider]  calcium carbonate (TUMS - DOSED IN MG ELEMENTAL CALCIUM) 500 MG chewable tablet Chew 1 tablet by mouth daily.   Yes [provider]  Cholecalciferol (VITAMIN D3 PO) Take 1 capsule by mouth daily.   Yes [provider]  gabapentin (NEURONTIN) 300 MG capsule Take 300 mg by mouth 2 (two) times daily.  01/23/17  Yes [provider]  glycopyrrolate (ROBINUL) 1 MG tablet Take  1 mg by mouth 2 (two) times daily.  08/23/18  Yes [provider]  predniSONE (DELTASONE) 50 MG tablet 1 tablet daily x 5 days 11/26/20  Yes Bryton Romagnoli G, DO  SUMAtriptan (IMITREX) 100 MG tablet Take 100 mg by mouth daily as needed for migraine.  01/06/17  Yes [provider]  tiZANidine (ZANAFLEX) 4 MG tablet Take 4 mg by mouth every 8 (eight) hours as needed for muscle spasms.  01/06/17  Yes [provider]  traMADol (ULTRAM) 50 MG tablet Take 50-100 mg by mouth 2 (two) times daily as  needed for moderate pain.  01/06/17  Yes [provider]  bisoprolol-hydrochlorothiazide (ZIAC) 5-6.25 MG tablet Take 1 tablet by mouth daily.  01/06/17 11/26/20  [provider]  pantoprazole (PROTONIX) 40 MG tablet Take 1 tablet (40 mg total) by mouth daily. 10/27/18 11/26/20  Luretha Murphy, MD  triamterene-hydrochlorothiazide (DYAZIDE) 37.5-25 MG capsule Take 1 capsule by mouth daily.   11/26/20  [provider]    Family History Family History  Problem Relation Age of Onset   Diabetes Mellitus I Mother    Hypertension Maternal Grandmother    Hyperlipidemia Maternal Grandmother     Social History Social History   Tobacco Use   Smoking status: Never Smoker   Smokeless tobacco: Never Used   Tobacco comment: IN TEENS   Vaping Use   Vaping Use: Never used  Substance Use Topics   Alcohol use: No   Drug use: No     Allergies   Patient has no known allergies.   Review of Systems Review of Systems Per HPI  Physical Exam Triage Vital Signs ED Triage Vitals  Enc Vitals Group     BP 11/26/20 2035 115/81     Pulse Rate 11/26/20 2035 84     Resp 11/26/20 2035 19     Temp 11/26/20 2035 98.4 F (36.9 C)     Temp Source 11/26/20 2035 Oral     SpO2 11/26/20 2035 99 %     Weight 11/26/20 2030 134 lb (60.8 kg)     Height 11/26/20 2030 5' 1.5" (1.562 m)     Head Circumference --      Peak Flow --      Pain Score 11/26/20 2029 5     Pain Loc --      Pain Edu? --      Excl. in GC? --    Updated Vital Signs BP 115/81 (BP Location: Right Arm)    Pulse 84    Temp 98.4 F (36.9 C) (Oral)    Resp 19    Ht 5' 1.5" (1.562 m)    Wt 60.8 kg    SpO2 99%    BMI 24.91 kg/m   Visual Acuity Right Eye Distance:   Left Eye Distance:   Bilateral Distance:    Right Eye Near:   Left Eye Near:    Bilateral Near:     Physical Exam Vitals and nursing note reviewed.  Constitutional:      General: She is not in acute distress.    Appearance: Normal  appearance. She is not ill-appearing.  HENT:     Head: Normocephalic and atraumatic.     Mouth/Throat:     Pharynx: Posterior oropharyngeal erythema present.  Eyes:     General:        Right eye: No discharge.        Left eye: No discharge.     Conjunctiva/sclera: Conjunctivae normal.  Cardiovascular:     Rate and Rhythm: Normal rate and regular rhythm.  Pulmonary:     Effort: Pulmonary effort is normal.     Breath sounds: Normal breath sounds. No wheezing, rhonchi or rales.  Neurological:     Mental Status: She is alert.  Psychiatric:        Mood and Affect: Mood normal.        Behavior: Behavior normal.    UC Treatments / Results  Labs (all labs ordered are listed, but only abnormal results are displayed) Labs Reviewed  RESP PANEL BY RT-PCR (FLU A&B, COVID) ARPGX2 - Abnormal; Notable for the following components:      Result Value   SARS Coronavirus 2 by RT PCR POSITIVE (*)    All other components within normal limits    EKG   Radiology No results found.  Procedures Procedures (including critical care time)  Medications Ordered in UC Medications - No data to display  Initial Impression / Assessment and Plan / UC Course  I have reviewed the triage vital signs and the nursing notes.  Pertinent labs & imaging results that were available during my care of the patient were reviewed by me and considered in my medical decision making (see chart for details).    39 year old female presents with COVID-19.  Patient has been febrile.  This is an acute illness with systemic symptoms.  Prednisone as directed given the fact that she often has trouble with her asthma in the setting of viral illnesses.  Information sent to infusion clinic.  Work note given.  Final Clinical Impressions(s) / UC Diagnoses   Final diagnoses:  COVID     Discharge Instructions     Tylenol as needed for fever.  Medication as prescribed.  Take care  Dr. Adriana Simas    ED Prescriptions     Medication Sig Dispense Auth. Provider   predniSONE (DELTASONE) 50 MG tablet 1 tablet daily x 5 days 5 tablet Everlene Other G, DO     PDMP not reviewed this encounter.   Tommie Sams, [place] 11/26/20 2145

## 2020-11-26 NOTE — Addendum Note (Signed)
Addended by: Bennie Pierini on: 11/26/2020 03:06 PM   Modules accepted: Orders

## 2020-11-26 NOTE — Discharge Instructions (Signed)
Tylenol as needed for fever.  Medication as prescribed.  Take care  Dr. Adriana Simas

## 2020-11-27 ENCOUNTER — Other Ambulatory Visit: Payer: Self-pay | Admitting: Family Medicine

## 2020-11-27 DIAGNOSIS — U071 COVID-19: Secondary | ICD-10-CM | POA: Diagnosis not present

## 2020-11-27 DIAGNOSIS — J4521 Mild intermittent asthma with (acute) exacerbation: Secondary | ICD-10-CM | POA: Diagnosis not present

## 2020-11-28 ENCOUNTER — Telehealth (HOSPITAL_COMMUNITY): Payer: Self-pay

## 2020-11-28 NOTE — Telephone Encounter (Signed)
Called to Discuss with patient about Covid symptoms and the use of the monoclonal antibody infusion for those with mild to moderate Covid symptoms and at a high risk of hospitalization.     Pt appears to qualify for this infusion due to co-morbid conditions and/or a member of an at-risk group in accordance with the FDA Emergency Use Authorization.    Unable to reach pt, LVM     

## 2020-12-04 ENCOUNTER — Other Ambulatory Visit: Payer: Self-pay | Admitting: Internal Medicine

## 2020-12-05 ENCOUNTER — Other Ambulatory Visit: Payer: Self-pay | Admitting: Physical Medicine and Rehabilitation

## 2020-12-26 ENCOUNTER — Other Ambulatory Visit: Payer: Self-pay | Admitting: Physical Medicine and Rehabilitation

## 2020-12-26 DIAGNOSIS — M5030 Other cervical disc degeneration, unspecified cervical region: Secondary | ICD-10-CM | POA: Diagnosis not present

## 2020-12-26 DIAGNOSIS — M62838 Other muscle spasm: Secondary | ICD-10-CM | POA: Diagnosis not present

## 2020-12-26 DIAGNOSIS — M5412 Radiculopathy, cervical region: Secondary | ICD-10-CM | POA: Diagnosis not present

## 2021-01-14 ENCOUNTER — Other Ambulatory Visit: Payer: Self-pay | Admitting: Internal Medicine

## 2021-02-01 ENCOUNTER — Other Ambulatory Visit: Payer: Self-pay | Admitting: Internal Medicine

## 2021-02-14 ENCOUNTER — Other Ambulatory Visit: Payer: Self-pay | Admitting: Internal Medicine

## 2021-02-28 DIAGNOSIS — Z9884 Bariatric surgery status: Secondary | ICD-10-CM | POA: Diagnosis not present

## 2021-02-28 DIAGNOSIS — Z79899 Other long term (current) drug therapy: Secondary | ICD-10-CM | POA: Diagnosis not present

## 2021-03-01 ENCOUNTER — Other Ambulatory Visit: Payer: Self-pay | Admitting: Internal Medicine

## 2021-03-01 DIAGNOSIS — J4520 Mild intermittent asthma, uncomplicated: Secondary | ICD-10-CM | POA: Diagnosis not present

## 2021-03-01 DIAGNOSIS — E282 Polycystic ovarian syndrome: Secondary | ICD-10-CM | POA: Diagnosis not present

## 2021-03-01 DIAGNOSIS — Z9884 Bariatric surgery status: Secondary | ICD-10-CM | POA: Diagnosis not present

## 2021-03-01 DIAGNOSIS — I10 Essential (primary) hypertension: Secondary | ICD-10-CM | POA: Diagnosis not present

## 2021-03-04 ENCOUNTER — Other Ambulatory Visit: Payer: Self-pay

## 2021-03-04 MED FILL — Tizanidine HCl Tab 4 MG (Base Equivalent): ORAL | 20 days supply | Qty: 60 | Fill #0 | Status: AC

## 2021-03-07 ENCOUNTER — Other Ambulatory Visit: Payer: Self-pay

## 2021-03-07 MED FILL — Tramadol HCl Tab 50 MG: ORAL | 25 days supply | Qty: 150 | Fill #0 | Status: AC

## 2021-03-29 ENCOUNTER — Other Ambulatory Visit: Payer: Self-pay

## 2021-03-29 MED FILL — Potassium Chloride Microencapsulated Crys ER Tab 20 mEq: ORAL | 90 days supply | Qty: 90 | Fill #0 | Status: AC

## 2021-03-29 MED FILL — Gabapentin Cap 300 MG: ORAL | 90 days supply | Qty: 90 | Fill #0 | Status: AC

## 2021-04-12 ENCOUNTER — Other Ambulatory Visit: Payer: Self-pay

## 2021-04-12 MED FILL — Tramadol HCl Tab 50 MG: ORAL | 25 days supply | Qty: 150 | Fill #1 | Status: AC

## 2021-04-12 MED FILL — Sumatriptan Succinate Tab 100 MG: ORAL | 30 days supply | Qty: 9 | Fill #0 | Status: AC

## 2021-04-12 MED FILL — Ondansetron Orally Disintegrating Tab 4 MG: ORAL | 30 days supply | Qty: 90 | Fill #0 | Status: AC

## 2021-04-12 MED FILL — Tizanidine HCl Tab 4 MG (Base Equivalent): ORAL | 60 days supply | Qty: 180 | Fill #0 | Status: AC

## 2021-04-15 ENCOUNTER — Other Ambulatory Visit: Payer: Self-pay

## 2021-04-18 DIAGNOSIS — L720 Epidermal cyst: Secondary | ICD-10-CM | POA: Diagnosis not present

## 2021-04-21 ENCOUNTER — Encounter: Payer: Self-pay | Admitting: Physician Assistant

## 2021-04-21 ENCOUNTER — Telehealth: Payer: 59 | Admitting: Physician Assistant

## 2021-04-21 DIAGNOSIS — B373 Candidiasis of vulva and vagina: Secondary | ICD-10-CM

## 2021-04-21 DIAGNOSIS — M545 Low back pain, unspecified: Secondary | ICD-10-CM

## 2021-04-21 DIAGNOSIS — N3000 Acute cystitis without hematuria: Secondary | ICD-10-CM | POA: Diagnosis not present

## 2021-04-21 DIAGNOSIS — B3731 Acute candidiasis of vulva and vagina: Secondary | ICD-10-CM

## 2021-04-21 DIAGNOSIS — N898 Other specified noninflammatory disorders of vagina: Secondary | ICD-10-CM

## 2021-04-21 MED ORDER — FLUCONAZOLE 150 MG PO TABS
150.0000 mg | ORAL_TABLET | Freq: Once | ORAL | 0 refills | Status: AC
  Filled 2021-04-21: qty 1, 1d supply, fill #0

## 2021-04-21 MED ORDER — SULFAMETHOXAZOLE-TRIMETHOPRIM 800-160 MG PO TABS
1.0000 | ORAL_TABLET | Freq: Two times a day (BID) | ORAL | 0 refills | Status: DC
  Filled 2021-04-21: qty 10, 5d supply, fill #0

## 2021-04-21 NOTE — Progress Notes (Signed)
We are sorry that you are not feeling well. Here is how we plan to help! Based on what you shared with me it looks like you: May have a yeast vaginosis  Vaginosis is an inflammation of the vagina that can result in discharge, itching and pain. The cause is usually a change in the normal balance of vaginal bacteria or an infection. Vaginosis can also result from reduced estrogen levels after menopause.  The most common causes of vaginosis are:   Bacterial vaginosis which results from an overgrowth of one on several organisms that are normally present in your vagina.   Yeast infections which are caused by a naturally occurring fungus called candida.   Vaginal atrophy (atrophic vaginosis) which results from the thinning of the vagina from reduced estrogen levels after menopause.   Trichomoniasis which is caused by a parasite and is commonly transmitted by sexual intercourse.  Factors that increase your risk of developing vaginosis include: . Medications, such as antibiotics and steroids . Uncontrolled diabetes . Use of hygiene products such as bubble bath, vaginal spray or vaginal deodorant . Douching . Wearing damp or tight-fitting clothing . Using an intrauterine device (IUD) for birth control . Hormonal changes, such as those associated with pregnancy, birth control pills or menopause . Sexual activity . Having a sexually transmitted infection  Your treatment plan is A single Diflucan (fluconazole) 150mg tablet once.  I have electronically sent this prescription into the pharmacy that you have chosen.  Be sure to take all of the medication as directed. Stop taking any medication if you develop a rash, tongue swelling or shortness of breath. Mothers who are breast feeding should consider pumping and discarding their breast milk while on these antibiotics. However, there is no consensus that infant exposure at these doses would be harmful.  Remember that medication creams can weaken latex  condoms. .   HOME CARE:  Good hygiene may prevent some types of vaginosis from recurring and may relieve some symptoms:  . Avoid baths, hot tubs and whirlpool spas. Rinse soap from your outer genital area after a shower, and dry the area well to prevent irritation. Don't use scented or harsh soaps, such as those with deodorant or antibacterial action. . Avoid irritants. These include scented tampons and pads. . Wipe from front to back after using the toilet. Doing so avoids spreading fecal bacteria to your vagina.  Other things that may help prevent vaginosis include:  . Don't douche. Your vagina doesn't require cleansing other than normal bathing. Repetitive douching disrupts the normal organisms that reside in the vagina and can actually increase your risk of vaginal infection. Douching won't clear up a vaginal infection. . Use a latex condom. Both female and female latex condoms may help you avoid infections spread by sexual contact. . Wear cotton underwear. Also wear pantyhose with a cotton crotch. If you feel comfortable without it, skip wearing underwear to bed. Yeast thrives in moist environments Your symptoms should improve in the next day or two.  GET HELP RIGHT AWAY IF:  . You have pain in your lower abdomen ( pelvic area or over your ovaries) . You develop nausea or vomiting . You develop a fever . Your discharge changes or worsens . You have persistent pain with intercourse . You develop shortness of breath, a rapid pulse, or you faint.  These symptoms could be signs of problems or infections that need to be evaluated by a medical provider now.  MAKE SURE YOU      Understand these instructions.  Will watch your condition.  Will get help right away if you are not doing well or get worse.  Your e-visit answers were reviewed by a board certified advanced clinical practitioner to complete your personal care plan. Depending upon the condition, your plan could have included  both over the counter or prescription medications. Please review your pharmacy choice to make sure that you have choses a pharmacy that is open for you to pick up any needed prescription, Your safety is important to us. If you have drug allergies check your prescription carefully.   You can use MyChart to ask questions about today's visit, request a non-urgent call back, or ask for a work or school excuse for 24 hours related to this e-Visit. If it has been greater than 24 hours you will need to follow up with your provider, or enter a new e-Visit to address those concerns. You will get a MyChart message within the next two days asking about your experience. I hope that your e-visit has been valuable and will speed your recovery.  I spent 5-10 minutes on review and completion of this note- Masashi Snowdon PAC  

## 2021-04-21 NOTE — Progress Notes (Signed)
We are sorry that you are not feeling well.  Here is how we plan to help!  Based on what you shared with me it looks like you most likely have a simple urinary tract infection.  A UTI (Urinary Tract Infection) is a bacterial infection of the bladder.  Most cases of urinary tract infections are simple to treat but a key part of your care is to encourage you to drink plenty of fluids and watch your symptoms carefully.  I have prescribed Bactrim DS One tablet twice a day for 5 days.  Your symptoms should gradually improve. Call us if the burning in your urine worsens, you develop worsening fever, back pain or pelvic pain or if your symptoms do not resolve after completing the antibiotic.   As discussed, back pain in the setting of UTI is likely a more complicated infection such as kidney infection, which must be treated differently that simple UTI. You have stated that you have similar back pain with previous UTI and denied history of kidney infection . If any changes to your symptoms, please have a face to face visit for further evaluation of your symptoms.  You have also been encouraged to complete another Evisit for your symptoms of vaginal discharge.   Urinary tract infections can be prevented by drinking plenty of water to keep your body hydrated.  Also be sure when you wipe, wipe from front to back and don't hold it in!  If possible, empty your bladder every 4 hours.  Your e-visit answers were reviewed by a board certified advanced clinical practitioner to complete your personal care plan.  Depending on the condition, your plan could have included both over the counter or prescription medications.  If there is a problem please reply  once you have received a response from your provider.  Your safety is important to [place].  If you have drug allergies check your prescription carefully.    You can use MyChart to ask questions about today's visit, request a non-urgent call back, or ask for a work  or school excuse for 24 hours related to this e-Visit. If it has been greater than 24 hours you will need to follow up with your provider, or enter a new e-Visit to address those concerns.   You will get an e-mail in the next two days asking about your experience.  I hope that your e-visit has been valuable and will speed your recovery. Thank you for using e-visits.   I spent 5-10 minutes on review and completion of this note- Illa Level [facility]

## 2021-04-22 ENCOUNTER — Other Ambulatory Visit: Payer: Self-pay

## 2021-05-01 ENCOUNTER — Other Ambulatory Visit: Payer: Self-pay

## 2021-05-01 MED FILL — Sumatriptan Succinate Tab 100 MG: ORAL | 30 days supply | Qty: 9 | Fill #1 | Status: CN

## 2021-05-01 MED FILL — Tramadol HCl Tab 50 MG: ORAL | 25 days supply | Qty: 150 | Fill #2 | Status: CN

## 2021-05-01 MED FILL — Alprazolam Tab 0.5 MG: ORAL | 90 days supply | Qty: 270 | Fill #0 | Status: AC

## 2021-05-02 ENCOUNTER — Other Ambulatory Visit: Payer: Self-pay

## 2021-05-02 MED FILL — Sumatriptan Succinate Tab 100 MG: ORAL | 30 days supply | Qty: 9 | Fill #1 | Status: AC

## 2021-05-06 ENCOUNTER — Other Ambulatory Visit: Payer: Self-pay

## 2021-05-06 MED FILL — Tramadol HCl Tab 50 MG: ORAL | 25 days supply | Qty: 150 | Fill #2 | Status: AC

## 2021-05-27 ENCOUNTER — Other Ambulatory Visit: Payer: Self-pay

## 2021-05-27 MED FILL — Trazodone HCl Tab 50 MG: ORAL | 90 days supply | Qty: 90 | Fill #0 | Status: AC

## 2021-06-03 MED FILL — Tramadol HCl Tab 50 MG: ORAL | 25 days supply | Qty: 150 | Fill #3 | Status: AC

## 2021-06-04 ENCOUNTER — Other Ambulatory Visit: Payer: Self-pay

## 2021-06-24 MED FILL — Sumatriptan Succinate Tab 100 MG: ORAL | 30 days supply | Qty: 9 | Fill #2 | Status: AC

## 2021-06-25 ENCOUNTER — Other Ambulatory Visit: Payer: Self-pay

## 2021-06-27 ENCOUNTER — Other Ambulatory Visit: Payer: Self-pay

## 2021-06-27 DIAGNOSIS — M5412 Radiculopathy, cervical region: Secondary | ICD-10-CM | POA: Diagnosis not present

## 2021-06-27 DIAGNOSIS — M62838 Other muscle spasm: Secondary | ICD-10-CM | POA: Diagnosis not present

## 2021-06-27 DIAGNOSIS — M5030 Other cervical disc degeneration, unspecified cervical region: Secondary | ICD-10-CM | POA: Diagnosis not present

## 2021-06-27 MED ORDER — PREDNISONE 10 MG PO TABS
ORAL_TABLET | ORAL | 0 refills | Status: DC
  Filled 2021-06-27: qty 48, 12d supply, fill #0

## 2021-06-27 MED ORDER — TRAMADOL HCL 50 MG PO TABS
ORAL_TABLET | ORAL | 5 refills | Status: DC
  Filled 2021-06-27: qty 150, 25d supply, fill #0
  Filled 2021-07-24 – 2021-07-25 (×2): qty 150, 25d supply, fill #1
  Filled 2021-08-16: qty 150, 25d supply, fill #2
  Filled 2021-09-12: qty 150, 25d supply, fill #3
  Filled 2021-10-04: qty 150, 25d supply, fill #4
  Filled 2021-10-27 – 2021-10-31 (×2): qty 150, 25d supply, fill #5

## 2021-07-01 ENCOUNTER — Other Ambulatory Visit: Payer: Self-pay

## 2021-07-01 MED ORDER — HYDROCOD POLST-CPM POLST ER 10-8 MG/5ML PO SUER
ORAL | 0 refills | Status: DC
  Filled 2021-07-01: qty 120, 12d supply, fill #0

## 2021-07-02 ENCOUNTER — Other Ambulatory Visit: Payer: Self-pay

## 2021-07-02 MED FILL — Gabapentin Cap 300 MG: ORAL | 90 days supply | Qty: 90 | Fill #1 | Status: AC

## 2021-07-07 ENCOUNTER — Other Ambulatory Visit: Payer: Self-pay

## 2021-07-08 ENCOUNTER — Other Ambulatory Visit: Payer: Self-pay

## 2021-07-08 MED FILL — Tizanidine HCl Tab 4 MG (Base Equivalent): ORAL | 60 days supply | Qty: 180 | Fill #1 | Status: AC

## 2021-07-09 ENCOUNTER — Other Ambulatory Visit: Payer: Self-pay

## 2021-07-09 MED ORDER — ONDANSETRON 4 MG PO TBDP
ORAL_TABLET | ORAL | 5 refills | Status: DC
  Filled 2021-07-09: qty 30, 10d supply, fill #0
  Filled 2021-07-24: qty 30, 10d supply, fill #1
  Filled 2021-08-28: qty 30, 10d supply, fill #2
  Filled 2021-09-25: qty 30, 10d supply, fill #3
  Filled 2021-10-15: qty 30, 10d supply, fill #4
  Filled 2021-10-27: qty 30, 10d supply, fill #5

## 2021-07-24 ENCOUNTER — Other Ambulatory Visit: Payer: Self-pay

## 2021-07-24 MED FILL — Sumatriptan Succinate Tab 100 MG: ORAL | 30 days supply | Qty: 9 | Fill #3 | Status: AC

## 2021-07-25 ENCOUNTER — Other Ambulatory Visit: Payer: Self-pay

## 2021-07-31 ENCOUNTER — Other Ambulatory Visit: Payer: Self-pay

## 2021-07-31 MED FILL — Alprazolam Tab 0.5 MG: ORAL | 90 days supply | Qty: 270 | Fill #1 | Status: AC

## 2021-08-16 ENCOUNTER — Other Ambulatory Visit: Payer: Self-pay

## 2021-08-28 ENCOUNTER — Other Ambulatory Visit: Payer: Self-pay

## 2021-08-28 MED FILL — Sumatriptan Succinate Tab 100 MG: ORAL | 30 days supply | Qty: 9 | Fill #4 | Status: AC

## 2021-08-28 MED FILL — Potassium Chloride Microencapsulated Crys ER Tab 20 mEq: ORAL | 90 days supply | Qty: 90 | Fill #1 | Status: AC

## 2021-08-29 ENCOUNTER — Other Ambulatory Visit: Payer: Self-pay

## 2021-08-29 MED ORDER — TRAZODONE HCL 50 MG PO TABS
50.0000 mg | ORAL_TABLET | Freq: Every day | ORAL | 1 refills | Status: DC
  Filled 2021-08-29: qty 90, 90d supply, fill #0

## 2021-08-30 ENCOUNTER — Other Ambulatory Visit: Payer: Self-pay

## 2021-08-30 MED ORDER — TRAZODONE HCL 50 MG PO TABS
50.0000 mg | ORAL_TABLET | Freq: Every day | ORAL | 1 refills | Status: DC
  Filled 2021-08-30: qty 90, 90d supply, fill #0

## 2021-09-02 ENCOUNTER — Other Ambulatory Visit: Payer: Self-pay

## 2021-09-02 MED ORDER — PREDNISONE 10 MG PO TABS
ORAL_TABLET | ORAL | 0 refills | Status: DC
  Filled 2021-09-02: qty 48, 12d supply, fill #0

## 2021-09-05 ENCOUNTER — Other Ambulatory Visit: Payer: Self-pay

## 2021-09-05 DIAGNOSIS — Z9884 Bariatric surgery status: Secondary | ICD-10-CM | POA: Diagnosis not present

## 2021-09-05 DIAGNOSIS — I10 Essential (primary) hypertension: Secondary | ICD-10-CM | POA: Diagnosis not present

## 2021-09-05 DIAGNOSIS — Z79899 Other long term (current) drug therapy: Secondary | ICD-10-CM | POA: Diagnosis not present

## 2021-09-05 DIAGNOSIS — R748 Abnormal levels of other serum enzymes: Secondary | ICD-10-CM | POA: Diagnosis not present

## 2021-09-05 DIAGNOSIS — Z0000 Encounter for general adult medical examination without abnormal findings: Secondary | ICD-10-CM | POA: Diagnosis not present

## 2021-09-05 MED ORDER — UBRELVY 50 MG PO TABS
ORAL_TABLET | ORAL | 0 refills | Status: DC
  Filled 2021-09-05 – 2021-09-13 (×2): qty 10, 30d supply, fill #0

## 2021-09-12 ENCOUNTER — Other Ambulatory Visit: Payer: Self-pay

## 2021-09-16 ENCOUNTER — Other Ambulatory Visit: Payer: Self-pay

## 2021-09-19 ENCOUNTER — Other Ambulatory Visit: Payer: Self-pay

## 2021-09-19 DIAGNOSIS — G4733 Obstructive sleep apnea (adult) (pediatric): Secondary | ICD-10-CM | POA: Diagnosis not present

## 2021-09-19 DIAGNOSIS — Z9884 Bariatric surgery status: Secondary | ICD-10-CM | POA: Diagnosis not present

## 2021-09-19 DIAGNOSIS — G43719 Chronic migraine without aura, intractable, without status migrainosus: Secondary | ICD-10-CM | POA: Diagnosis not present

## 2021-09-19 MED ORDER — EMGALITY 120 MG/ML ~~LOC~~ SOAJ
SUBCUTANEOUS | 11 refills | Status: DC
  Filled 2021-09-19 – 2021-10-20 (×2): qty 1, 28d supply, fill #0
  Filled 2021-11-16: qty 1, 28d supply, fill #1
  Filled 2021-12-17: qty 1, 28d supply, fill #2
  Filled 2022-01-13: qty 1, 28d supply, fill #3
  Filled 2022-02-08: qty 1, 28d supply, fill #4
  Filled 2022-03-09: qty 1, 30d supply, fill #5
  Filled 2022-03-10 – 2022-05-14 (×8): qty 1, 28d supply, fill #5
  Filled 2022-06-11 (×2): qty 1, 28d supply, fill #6
  Filled 2022-06-12: qty 1, 30d supply, fill #6
  Filled 2022-08-19: qty 1, 30d supply, fill #7

## 2021-09-19 MED ORDER — EMGALITY 120 MG/ML ~~LOC~~ SOAJ
SUBCUTANEOUS | 0 refills | Status: DC
  Filled 2021-09-19 – 2021-09-20 (×2): qty 2, 28d supply, fill #0

## 2021-09-19 MED ORDER — EMGALITY 120 MG/ML ~~LOC~~ SOAJ
SUBCUTANEOUS | 11 refills | Status: DC
  Filled 2021-09-19: qty 1, 28d supply, fill #0
  Filled 2022-03-14: qty 1, 30d supply, fill #0
  Filled 2022-03-14: qty 1, 28d supply, fill #0
  Filled 2022-04-11: qty 1, 30d supply, fill #1
  Filled 2022-07-15: qty 1, 30d supply, fill #2
  Filled 2022-09-18: qty 1, 30d supply, fill #3

## 2021-09-20 ENCOUNTER — Other Ambulatory Visit: Payer: Self-pay

## 2021-09-23 ENCOUNTER — Other Ambulatory Visit: Payer: Self-pay

## 2021-09-25 ENCOUNTER — Other Ambulatory Visit: Payer: Self-pay

## 2021-09-25 MED FILL — Tizanidine HCl Tab 4 MG (Base Equivalent): ORAL | 60 days supply | Qty: 180 | Fill #2 | Status: AC

## 2021-09-25 MED FILL — Gabapentin Cap 300 MG: ORAL | 90 days supply | Qty: 90 | Fill #2 | Status: AC

## 2021-09-25 MED FILL — Sumatriptan Succinate Tab 100 MG: ORAL | 30 days supply | Qty: 9 | Fill #5 | Status: AC

## 2021-09-26 ENCOUNTER — Other Ambulatory Visit: Payer: Self-pay

## 2021-10-07 ENCOUNTER — Other Ambulatory Visit: Payer: Self-pay

## 2021-10-15 ENCOUNTER — Other Ambulatory Visit: Payer: Self-pay

## 2021-10-16 ENCOUNTER — Other Ambulatory Visit: Payer: Self-pay

## 2021-10-16 MED ORDER — UBRELVY 50 MG PO TABS
ORAL_TABLET | ORAL | 0 refills | Status: DC
  Filled 2021-10-16: qty 10, 30d supply, fill #0

## 2021-10-21 ENCOUNTER — Other Ambulatory Visit: Payer: Self-pay

## 2021-10-22 DIAGNOSIS — Z01419 Encounter for gynecological examination (general) (routine) without abnormal findings: Secondary | ICD-10-CM | POA: Diagnosis not present

## 2021-10-22 DIAGNOSIS — Z124 Encounter for screening for malignant neoplasm of cervix: Secondary | ICD-10-CM | POA: Diagnosis not present

## 2021-10-22 DIAGNOSIS — N941 Unspecified dyspareunia: Secondary | ICD-10-CM | POA: Diagnosis not present

## 2021-10-22 DIAGNOSIS — F5222 Female sexual arousal disorder: Secondary | ICD-10-CM | POA: Diagnosis not present

## 2021-10-23 ENCOUNTER — Other Ambulatory Visit: Payer: Self-pay

## 2021-10-23 MED FILL — Sumatriptan Succinate Tab 100 MG: ORAL | 30 days supply | Qty: 9 | Fill #0 | Status: AC

## 2021-10-27 ENCOUNTER — Other Ambulatory Visit: Payer: Self-pay

## 2021-10-28 ENCOUNTER — Other Ambulatory Visit: Payer: Self-pay

## 2021-10-28 MED ORDER — ALPRAZOLAM 0.5 MG PO TABS
ORAL_TABLET | ORAL | 1 refills | Status: DC
  Filled 2021-11-04: qty 270, 90d supply, fill #0
  Filled 2022-02-03: qty 270, 90d supply, fill #1

## 2021-10-28 MED ORDER — TRAZODONE HCL 100 MG PO TABS
ORAL_TABLET | ORAL | 1 refills | Status: DC
  Filled 2021-10-28: qty 90, 90d supply, fill #0
  Filled 2022-01-26: qty 90, 90d supply, fill #1

## 2021-10-31 ENCOUNTER — Other Ambulatory Visit: Payer: Self-pay

## 2021-11-04 ENCOUNTER — Other Ambulatory Visit: Payer: Self-pay

## 2021-11-05 ENCOUNTER — Other Ambulatory Visit: Payer: Self-pay

## 2021-11-05 MED ORDER — TESTOSTERONE CYPIONATE 200 MG/ML IM SOLN
INTRAMUSCULAR | 1 refills | Status: DC
  Filled 2021-11-05 – 2021-11-07 (×4): qty 1, 30d supply, fill #0
  Filled 2022-03-06: qty 1, 30d supply, fill #1

## 2021-11-06 ENCOUNTER — Other Ambulatory Visit: Payer: Self-pay

## 2021-11-07 ENCOUNTER — Other Ambulatory Visit: Payer: Self-pay

## 2021-11-07 MED ORDER — PREDNISONE 10 MG PO TABS
ORAL_TABLET | ORAL | 0 refills | Status: DC
  Filled 2021-11-07: qty 48, 12d supply, fill #0

## 2021-11-08 DIAGNOSIS — F5222 Female sexual arousal disorder: Secondary | ICD-10-CM | POA: Diagnosis not present

## 2021-11-15 ENCOUNTER — Other Ambulatory Visit: Payer: Self-pay

## 2021-11-15 MED ORDER — UBRELVY 50 MG PO TABS
ORAL_TABLET | ORAL | 0 refills | Status: DC
  Filled 2021-11-15: qty 10, 30d supply, fill #0

## 2021-11-15 MED ORDER — CARESTART COVID-19 HOME TEST VI KIT
PACK | 0 refills | Status: DC
Start: 2021-11-15 — End: 2021-12-19
  Filled 2021-11-15: qty 1, 2d supply, fill #0

## 2021-11-17 ENCOUNTER — Other Ambulatory Visit: Payer: Self-pay

## 2021-11-18 ENCOUNTER — Other Ambulatory Visit: Payer: Self-pay

## 2021-11-18 DIAGNOSIS — F5222 Female sexual arousal disorder: Secondary | ICD-10-CM | POA: Diagnosis not present

## 2021-11-19 ENCOUNTER — Other Ambulatory Visit: Payer: Self-pay

## 2021-11-19 MED ORDER — TRAMADOL HCL 50 MG PO TABS
ORAL_TABLET | ORAL | 0 refills | Status: DC
  Filled 2021-11-25: qty 150, 30d supply, fill #0

## 2021-11-25 ENCOUNTER — Other Ambulatory Visit: Payer: Self-pay

## 2021-11-26 ENCOUNTER — Other Ambulatory Visit: Payer: Self-pay

## 2021-11-29 DIAGNOSIS — F5222 Female sexual arousal disorder: Secondary | ICD-10-CM | POA: Diagnosis not present

## 2021-12-01 ENCOUNTER — Other Ambulatory Visit: Payer: Self-pay

## 2021-12-01 MED FILL — Sumatriptan Succinate Tab 100 MG: ORAL | 30 days supply | Qty: 9 | Fill #1 | Status: AC

## 2021-12-02 ENCOUNTER — Other Ambulatory Visit: Payer: Self-pay

## 2021-12-02 MED ORDER — UBRELVY 50 MG PO TABS
ORAL_TABLET | ORAL | 0 refills | Status: DC
  Filled 2021-12-02: qty 10, 30d supply, fill #0

## 2021-12-02 MED ORDER — ONDANSETRON 4 MG PO TBDP
ORAL_TABLET | ORAL | 5 refills | Status: DC
  Filled 2021-12-02: qty 30, 10d supply, fill #0
  Filled 2022-01-10: qty 30, 10d supply, fill #1
  Filled 2022-01-26: qty 30, 10d supply, fill #2
  Filled 2022-02-08: qty 30, 10d supply, fill #3
  Filled 2022-03-06: qty 30, 10d supply, fill #4
  Filled 2022-04-02: qty 30, 10d supply, fill #5

## 2021-12-05 DIAGNOSIS — F5222 Female sexual arousal disorder: Secondary | ICD-10-CM | POA: Diagnosis not present

## 2021-12-09 ENCOUNTER — Other Ambulatory Visit: Payer: Self-pay

## 2021-12-17 ENCOUNTER — Other Ambulatory Visit: Payer: Self-pay

## 2021-12-17 MED FILL — Tizanidine HCl Tab 4 MG (Base Equivalent): ORAL | 60 days supply | Qty: 180 | Fill #3 | Status: AC

## 2021-12-19 ENCOUNTER — Other Ambulatory Visit: Payer: Self-pay

## 2021-12-19 MED ORDER — TRAMADOL HCL 50 MG PO TABS
ORAL_TABLET | ORAL | 0 refills | Status: DC
  Filled 2021-12-19: qty 150, 25d supply, fill #0

## 2021-12-19 MED ORDER — CARESTART COVID-19 HOME TEST VI KIT
PACK | 0 refills | Status: DC
  Filled 2021-12-19: qty 2, 4d supply, fill #0

## 2021-12-20 ENCOUNTER — Other Ambulatory Visit: Payer: Self-pay

## 2021-12-29 MED FILL — Gabapentin Cap 300 MG: ORAL | 90 days supply | Qty: 90 | Fill #3 | Status: CN

## 2021-12-30 ENCOUNTER — Other Ambulatory Visit: Payer: Self-pay

## 2021-12-30 ENCOUNTER — Other Ambulatory Visit (HOSPITAL_COMMUNITY): Payer: Self-pay

## 2021-12-30 MED FILL — Gabapentin Cap 300 MG: ORAL | 90 days supply | Qty: 90 | Fill #3 | Status: AC

## 2022-01-02 DIAGNOSIS — F5222 Female sexual arousal disorder: Secondary | ICD-10-CM | POA: Diagnosis not present

## 2022-01-10 ENCOUNTER — Other Ambulatory Visit: Payer: Self-pay

## 2022-01-11 MED FILL — Sumatriptan Succinate Tab 100 MG: ORAL | 30 days supply | Qty: 9 | Fill #2 | Status: AC

## 2022-01-13 ENCOUNTER — Other Ambulatory Visit: Payer: Self-pay

## 2022-01-13 MED ORDER — TRAMADOL HCL 50 MG PO TABS
ORAL_TABLET | ORAL | 0 refills | Status: DC
  Filled 2022-01-13: qty 150, 25d supply, fill #0

## 2022-01-16 ENCOUNTER — Other Ambulatory Visit: Payer: Self-pay

## 2022-01-16 DIAGNOSIS — M62838 Other muscle spasm: Secondary | ICD-10-CM | POA: Diagnosis not present

## 2022-01-16 DIAGNOSIS — M5412 Radiculopathy, cervical region: Secondary | ICD-10-CM | POA: Diagnosis not present

## 2022-01-16 DIAGNOSIS — M5030 Other cervical disc degeneration, unspecified cervical region: Secondary | ICD-10-CM | POA: Diagnosis not present

## 2022-01-16 MED ORDER — TRAMADOL HCL 50 MG PO TABS
ORAL_TABLET | ORAL | 5 refills | Status: DC
  Filled 2022-01-16 – 2022-02-05 (×2): qty 150, 25d supply, fill #0
  Filled 2022-02-28: qty 150, 25d supply, fill #1
  Filled 2022-03-23: qty 150, 25d supply, fill #2
  Filled 2022-04-16: qty 150, 25d supply, fill #3
  Filled 2022-05-07 – 2022-05-08 (×2): qty 150, 25d supply, fill #4
  Filled 2022-06-01: qty 150, 25d supply, fill #5

## 2022-01-26 ENCOUNTER — Other Ambulatory Visit: Payer: Self-pay

## 2022-01-27 ENCOUNTER — Other Ambulatory Visit: Payer: Self-pay

## 2022-01-27 MED ORDER — UBRELVY 50 MG PO TABS
ORAL_TABLET | ORAL | 0 refills | Status: DC
  Filled 2022-01-27: qty 10, 30d supply, fill #0

## 2022-02-03 ENCOUNTER — Other Ambulatory Visit: Payer: Self-pay

## 2022-02-05 ENCOUNTER — Other Ambulatory Visit: Payer: Self-pay

## 2022-02-08 MED FILL — Sumatriptan Succinate Tab 100 MG: ORAL | 30 days supply | Qty: 9 | Fill #3 | Status: AC

## 2022-02-10 ENCOUNTER — Other Ambulatory Visit: Payer: Self-pay

## 2022-02-11 ENCOUNTER — Other Ambulatory Visit: Payer: Self-pay

## 2022-02-12 ENCOUNTER — Other Ambulatory Visit: Payer: Self-pay

## 2022-02-12 MED ORDER — PREDNISONE 10 MG PO TABS
ORAL_TABLET | ORAL | 0 refills | Status: DC
  Filled 2022-02-12: qty 48, 12d supply, fill #0

## 2022-02-14 ENCOUNTER — Other Ambulatory Visit: Payer: Self-pay

## 2022-02-14 DIAGNOSIS — G43719 Chronic migraine without aura, intractable, without status migrainosus: Secondary | ICD-10-CM | POA: Diagnosis not present

## 2022-02-14 DIAGNOSIS — M5030 Other cervical disc degeneration, unspecified cervical region: Secondary | ICD-10-CM | POA: Diagnosis not present

## 2022-02-14 MED ORDER — NURTEC 75 MG PO TBDP
ORAL_TABLET | ORAL | 6 refills | Status: DC
  Filled 2022-02-14: qty 16, 30d supply, fill #0
  Filled 2022-03-28: qty 16, 30d supply, fill #1
  Filled 2022-04-28: qty 16, 30d supply, fill #2
  Filled 2022-06-01: qty 16, 30d supply, fill #3
  Filled 2022-07-08: qty 16, 30d supply, fill #4
  Filled 2022-08-12: qty 16, 30d supply, fill #5
  Filled 2022-09-17: qty 16, 30d supply, fill #6

## 2022-02-14 MED ORDER — NURTEC 75 MG PO TBDP
ORAL_TABLET | ORAL | 6 refills | Status: DC
Start: 2022-02-14 — End: 2022-02-14
  Filled 2022-02-14 (×2): qty 16, 30d supply, fill #0

## 2022-02-17 ENCOUNTER — Other Ambulatory Visit: Payer: Self-pay

## 2022-02-18 ENCOUNTER — Other Ambulatory Visit: Payer: Self-pay

## 2022-02-19 ENCOUNTER — Other Ambulatory Visit: Payer: Self-pay | Admitting: Internal Medicine

## 2022-02-19 DIAGNOSIS — Z1231 Encounter for screening mammogram for malignant neoplasm of breast: Secondary | ICD-10-CM

## 2022-02-20 ENCOUNTER — Other Ambulatory Visit: Payer: Self-pay

## 2022-02-20 ENCOUNTER — Ambulatory Visit
Admission: RE | Admit: 2022-02-20 | Discharge: 2022-02-20 | Disposition: A | Discharge status: Home / Self Care | Payer: 59 | Source: Ambulatory Visit | Attending: Internal Medicine | Admitting: Internal Medicine

## 2022-02-20 DIAGNOSIS — Z1231 Encounter for screening mammogram for malignant neoplasm of breast: Secondary | ICD-10-CM | POA: Insufficient documentation

## 2022-02-21 ENCOUNTER — Other Ambulatory Visit: Payer: Self-pay

## 2022-02-24 ENCOUNTER — Other Ambulatory Visit: Payer: Self-pay

## 2022-02-24 MED ORDER — TIZANIDINE HCL 4 MG PO TABS
ORAL_TABLET | ORAL | 3 refills | Status: DC
  Filled 2022-02-24: qty 180, 60d supply, fill #0
  Filled 2022-06-25: qty 180, 60d supply, fill #1
  Filled 2022-08-29: qty 180, 60d supply, fill #2
  Filled 2022-10-29: qty 180, 60d supply, fill #3

## 2022-02-25 ENCOUNTER — Other Ambulatory Visit: Payer: Self-pay

## 2022-02-25 MED ORDER — LEVOFLOXACIN 500 MG PO TABS
ORAL_TABLET | ORAL | 0 refills | Status: DC
  Filled 2022-02-25: qty 10, 10d supply, fill #0

## 2022-02-25 MED ORDER — HYDROCOD POLI-CHLORPHE POLI ER 10-8 MG/5ML PO SUER
ORAL | 0 refills | Status: DC
  Filled 2022-02-25: qty 115, 12d supply, fill #0
  Filled 2022-04-25: qty 115, 12d supply, fill #1

## 2022-02-25 MED ORDER — PREDNISONE 10 MG PO TABS
ORAL_TABLET | ORAL | 0 refills | Status: DC
  Filled 2022-02-25: qty 21, 6d supply, fill #0

## 2022-02-25 MED ORDER — TIZANIDINE HCL 4 MG PO TABS
ORAL_TABLET | ORAL | 3 refills | Status: DC
  Filled 2022-02-25: qty 180, 60d supply, fill #0
  Filled 2022-04-25: qty 159, 53d supply, fill #0
  Filled 2022-04-25: qty 21, 7d supply, fill #0
  Filled 2022-12-31: qty 180, 60d supply, fill #1

## 2022-02-28 ENCOUNTER — Other Ambulatory Visit: Payer: Self-pay

## 2022-03-03 ENCOUNTER — Other Ambulatory Visit: Payer: Self-pay

## 2022-03-03 MED ORDER — CARESTART COVID-19 HOME TEST VI KIT
PACK | 0 refills | Status: DC
Start: 2022-03-03 — End: 2022-11-08
  Filled 2022-03-03: qty 2, 4d supply, fill #0

## 2022-03-06 ENCOUNTER — Telehealth: Payer: 59 | Admitting: Family Medicine

## 2022-03-06 DIAGNOSIS — R0989 Other specified symptoms and signs involving the circulatory and respiratory systems: Secondary | ICD-10-CM

## 2022-03-06 NOTE — Progress Notes (Signed)
[place]  ? ?Reports enlarged lymph node- needs in person assessment  ? ?Message sent on mychart ?

## 2022-03-07 ENCOUNTER — Other Ambulatory Visit: Payer: Self-pay

## 2022-03-10 ENCOUNTER — Other Ambulatory Visit: Payer: Self-pay

## 2022-03-11 ENCOUNTER — Other Ambulatory Visit: Payer: Self-pay

## 2022-03-12 ENCOUNTER — Other Ambulatory Visit: Payer: Self-pay

## 2022-03-14 ENCOUNTER — Other Ambulatory Visit: Payer: Self-pay

## 2022-03-14 DIAGNOSIS — E282 Polycystic ovarian syndrome: Secondary | ICD-10-CM | POA: Diagnosis not present

## 2022-03-14 DIAGNOSIS — I10 Essential (primary) hypertension: Secondary | ICD-10-CM | POA: Diagnosis not present

## 2022-03-14 DIAGNOSIS — Z9884 Bariatric surgery status: Secondary | ICD-10-CM | POA: Diagnosis not present

## 2022-03-14 DIAGNOSIS — E349 Endocrine disorder, unspecified: Secondary | ICD-10-CM | POA: Diagnosis not present

## 2022-03-14 MED ORDER — TRAZODONE HCL 100 MG PO TABS
ORAL_TABLET | ORAL | 1 refills | Status: DC
  Filled 2022-03-14 – 2022-04-28 (×2): qty 90, 90d supply, fill #0
  Filled 2022-07-25: qty 90, 90d supply, fill #1

## 2022-03-14 MED ORDER — SUMATRIPTAN SUCCINATE 100 MG PO TABS
ORAL_TABLET | ORAL | 5 refills | Status: DC
  Filled 2022-03-14: qty 9, 30d supply, fill #0
  Filled 2022-04-11: qty 9, 30d supply, fill #1
  Filled 2022-05-12: qty 9, 30d supply, fill #2
  Filled 2022-06-11: qty 9, 30d supply, fill #3
  Filled 2022-07-15: qty 9, 30d supply, fill #4
  Filled 2022-08-19: qty 9, 30d supply, fill #5

## 2022-03-14 MED ORDER — GABAPENTIN 300 MG PO CAPS
ORAL_CAPSULE | ORAL | 3 refills | Status: DC
  Filled 2022-03-14: qty 90, 90d supply, fill #0
  Filled 2022-06-25: qty 90, 90d supply, fill #1
  Filled 2022-09-24: qty 90, 90d supply, fill #2
  Filled 2022-12-18: qty 90, 90d supply, fill #3

## 2022-03-19 ENCOUNTER — Other Ambulatory Visit: Payer: Self-pay

## 2022-03-19 MED ORDER — CEPHALEXIN 500 MG PO CAPS
ORAL_CAPSULE | ORAL | 0 refills | Status: DC
  Filled 2022-03-19: qty 30, 10d supply, fill #0

## 2022-03-24 ENCOUNTER — Other Ambulatory Visit: Payer: Self-pay

## 2022-03-28 ENCOUNTER — Other Ambulatory Visit: Payer: Self-pay

## 2022-03-31 ENCOUNTER — Other Ambulatory Visit: Payer: Self-pay

## 2022-04-01 ENCOUNTER — Other Ambulatory Visit: Payer: Self-pay

## 2022-04-02 ENCOUNTER — Other Ambulatory Visit: Payer: Self-pay

## 2022-04-11 ENCOUNTER — Other Ambulatory Visit: Payer: Self-pay

## 2022-04-16 ENCOUNTER — Other Ambulatory Visit: Payer: Self-pay

## 2022-04-25 ENCOUNTER — Other Ambulatory Visit: Payer: Self-pay

## 2022-04-25 MED ORDER — HYDROCOD POLI-CHLORPHE POLI ER 10-8 MG/5ML PO SUER
ORAL | 0 refills | Status: DC
  Filled 2022-04-25: qty 115, 23d supply, fill #0

## 2022-04-25 MED ORDER — CEPHALEXIN 500 MG PO CAPS
ORAL_CAPSULE | ORAL | 0 refills | Status: DC
  Filled 2022-04-25: qty 30, 10d supply, fill #0

## 2022-04-28 ENCOUNTER — Other Ambulatory Visit: Payer: Self-pay

## 2022-04-29 ENCOUNTER — Other Ambulatory Visit: Payer: Self-pay

## 2022-04-29 MED ORDER — ONDANSETRON 4 MG PO TBDP
ORAL_TABLET | ORAL | 5 refills | Status: DC
  Filled 2022-07-08: qty 30, 10d supply, fill #0
  Filled 2022-07-25: qty 30, 10d supply, fill #1
  Filled 2022-08-24: qty 30, 10d supply, fill #2
  Filled 2022-09-05: qty 30, 10d supply, fill #3
  Filled 2022-10-09: qty 30, 10d supply, fill #4
  Filled 2023-01-15 – 2023-01-19 (×2): qty 30, 10d supply, fill #5

## 2022-04-29 MED ORDER — ONDANSETRON 4 MG PO TBDP
ORAL_TABLET | ORAL | 5 refills | Status: DC
  Filled 2022-04-29: qty 30, 10d supply, fill #0
  Filled 2022-05-08: qty 30, 10d supply, fill #1
  Filled 2022-05-22: qty 30, 10d supply, fill #2
  Filled 2022-06-01: qty 30, 10d supply, fill #3
  Filled 2022-06-11: qty 30, 10d supply, fill #4
  Filled 2022-06-25: qty 30, 10d supply, fill #5

## 2022-04-29 MED ORDER — ALPRAZOLAM 0.5 MG PO TABS
ORAL_TABLET | ORAL | 1 refills | Status: DC
  Filled 2022-04-29 – 2022-05-02 (×2): qty 270, 90d supply, fill #0
  Filled 2022-07-30: qty 270, 90d supply, fill #1

## 2022-04-30 ENCOUNTER — Other Ambulatory Visit: Payer: Self-pay

## 2022-05-02 ENCOUNTER — Other Ambulatory Visit: Payer: Self-pay

## 2022-05-07 ENCOUNTER — Other Ambulatory Visit: Payer: Self-pay

## 2022-05-08 ENCOUNTER — Other Ambulatory Visit: Payer: Self-pay

## 2022-05-13 ENCOUNTER — Other Ambulatory Visit: Payer: Self-pay

## 2022-05-13 ENCOUNTER — Other Ambulatory Visit (HOSPITAL_COMMUNITY): Payer: Self-pay

## 2022-05-13 MED ORDER — ALBUTEROL SULFATE HFA 108 (90 BASE) MCG/ACT IN AERS
INHALATION_SPRAY | RESPIRATORY_TRACT | 3 refills | Status: DC
  Filled 2022-05-13: qty 18, 25d supply, fill #0
  Filled 2022-12-04: qty 6.7, 25d supply, fill #1
  Filled 2023-01-15 – 2023-01-19 (×2): qty 6.7, 25d supply, fill #2

## 2022-05-14 ENCOUNTER — Other Ambulatory Visit (HOSPITAL_COMMUNITY): Payer: Self-pay

## 2022-05-14 ENCOUNTER — Other Ambulatory Visit: Payer: Self-pay

## 2022-05-14 MED ORDER — LEVOFLOXACIN 500 MG PO TABS
ORAL_TABLET | ORAL | 0 refills | Status: DC
  Filled 2022-05-14: qty 10, 10d supply, fill #0

## 2022-05-14 MED ORDER — PREDNISONE 10 MG PO TABS
ORAL_TABLET | ORAL | 0 refills | Status: DC
  Filled 2022-05-14: qty 21, 6d supply, fill #0

## 2022-05-14 MED ORDER — HYDROCOD POLI-CHLORPHE POLI ER 10-8 MG/5ML PO SUER
ORAL | 0 refills | Status: DC
  Filled 2022-05-14: qty 115, 23d supply, fill #0

## 2022-05-15 ENCOUNTER — Other Ambulatory Visit (HOSPITAL_COMMUNITY): Payer: Self-pay

## 2022-05-21 ENCOUNTER — Encounter (HOSPITAL_COMMUNITY): Payer: Self-pay | Admitting: *Deleted

## 2022-05-23 ENCOUNTER — Other Ambulatory Visit: Payer: Self-pay

## 2022-05-27 DIAGNOSIS — Z79899 Other long term (current) drug therapy: Secondary | ICD-10-CM | POA: Diagnosis not present

## 2022-05-27 DIAGNOSIS — R233 Spontaneous ecchymoses: Secondary | ICD-10-CM | POA: Diagnosis not present

## 2022-06-02 ENCOUNTER — Other Ambulatory Visit: Payer: Self-pay

## 2022-06-11 ENCOUNTER — Other Ambulatory Visit: Payer: Self-pay

## 2022-06-12 ENCOUNTER — Other Ambulatory Visit (HOSPITAL_COMMUNITY): Payer: Self-pay

## 2022-06-12 ENCOUNTER — Other Ambulatory Visit: Payer: Self-pay

## 2022-06-24 ENCOUNTER — Other Ambulatory Visit: Payer: Self-pay

## 2022-06-24 MED ORDER — PREDNISONE 10 MG PO TABS
ORAL_TABLET | ORAL | 0 refills | Status: DC
  Filled 2022-06-24: qty 21, 6d supply, fill #0

## 2022-06-24 MED ORDER — TRAMADOL HCL 50 MG PO TABS: ORAL_TABLET | ORAL | 0 refills | Status: DC

## 2022-06-25 ENCOUNTER — Other Ambulatory Visit: Payer: Self-pay

## 2022-06-25 MED ORDER — TRAMADOL HCL 50 MG PO TABS
ORAL_TABLET | ORAL | 0 refills | Status: DC
  Filled 2022-06-25: qty 150, 25d supply, fill #0

## 2022-07-08 ENCOUNTER — Other Ambulatory Visit: Payer: Self-pay

## 2022-07-15 ENCOUNTER — Other Ambulatory Visit: Payer: Self-pay

## 2022-07-17 ENCOUNTER — Other Ambulatory Visit: Payer: Self-pay

## 2022-07-17 DIAGNOSIS — M5030 Other cervical disc degeneration, unspecified cervical region: Secondary | ICD-10-CM | POA: Diagnosis not present

## 2022-07-17 DIAGNOSIS — M62838 Other muscle spasm: Secondary | ICD-10-CM | POA: Diagnosis not present

## 2022-07-17 DIAGNOSIS — Z79899 Other long term (current) drug therapy: Secondary | ICD-10-CM | POA: Diagnosis not present

## 2022-07-17 DIAGNOSIS — M5412 Radiculopathy, cervical region: Secondary | ICD-10-CM | POA: Diagnosis not present

## 2022-07-17 MED ORDER — TRAMADOL HCL 50 MG PO TABS
ORAL_TABLET | ORAL | 5 refills | Status: DC
  Filled 2022-07-18: qty 180, 30d supply, fill #0

## 2022-07-18 ENCOUNTER — Other Ambulatory Visit: Payer: Self-pay

## 2022-07-18 MED ORDER — LEVOFLOXACIN 500 MG PO TABS
ORAL_TABLET | ORAL | 0 refills | Status: DC
  Filled 2022-07-18: qty 10, 10d supply, fill #0

## 2022-07-18 MED ORDER — PREDNISONE 10 MG PO TABS
ORAL_TABLET | ORAL | 0 refills | Status: DC
  Filled 2022-07-18: qty 21, 6d supply, fill #0

## 2022-07-18 MED ORDER — HYDROCOD POLI-CHLORPHE POLI ER 10-8 MG/5ML PO SUER
ORAL | 0 refills | Status: DC
  Filled 2022-07-18: qty 115, 23d supply, fill #0

## 2022-07-22 ENCOUNTER — Other Ambulatory Visit: Payer: Self-pay

## 2022-07-22 DIAGNOSIS — M5412 Radiculopathy, cervical region: Secondary | ICD-10-CM | POA: Diagnosis not present

## 2022-07-22 MED ORDER — OXYCODONE-ACETAMINOPHEN 10-325 MG PO TABS
ORAL_TABLET | ORAL | 0 refills | Status: DC
  Filled 2022-07-22 – 2022-07-23 (×3): qty 120, 30d supply, fill #0

## 2022-07-23 ENCOUNTER — Other Ambulatory Visit: Payer: Self-pay

## 2022-07-26 ENCOUNTER — Other Ambulatory Visit (HOSPITAL_COMMUNITY): Payer: Self-pay

## 2022-07-31 ENCOUNTER — Other Ambulatory Visit: Payer: Self-pay

## 2022-08-06 ENCOUNTER — Other Ambulatory Visit: Payer: Self-pay

## 2022-08-12 ENCOUNTER — Other Ambulatory Visit: Payer: Self-pay

## 2022-08-12 MED ORDER — ONDANSETRON 4 MG PO TBDP
ORAL_TABLET | ORAL | 5 refills | Status: DC
  Filled 2022-08-12: qty 30, 10d supply, fill #0
  Filled 2022-09-17: qty 30, 10d supply, fill #1
  Filled 2022-10-16: qty 30, 10d supply, fill #2
  Filled 2022-10-29: qty 30, 10d supply, fill #3
  Filled 2022-11-17: qty 30, 10d supply, fill #4
  Filled 2022-12-04: qty 30, 10d supply, fill #5

## 2022-08-15 ENCOUNTER — Other Ambulatory Visit: Payer: Self-pay

## 2022-08-15 MED ORDER — OXYCODONE-ACETAMINOPHEN 10-325 MG PO TABS
ORAL_TABLET | ORAL | 0 refills | Status: DC
  Filled 2022-08-21: qty 120, 30d supply, fill #0

## 2022-08-19 ENCOUNTER — Other Ambulatory Visit: Payer: Self-pay

## 2022-08-20 ENCOUNTER — Other Ambulatory Visit: Payer: Self-pay

## 2022-08-21 ENCOUNTER — Other Ambulatory Visit: Payer: Self-pay

## 2022-08-25 ENCOUNTER — Other Ambulatory Visit: Payer: Self-pay

## 2022-08-25 MED ORDER — METRONIDAZOLE 500 MG PO TABS
500.0000 mg | ORAL_TABLET | Freq: Two times a day (BID) | ORAL | 0 refills | Status: DC
  Filled 2022-08-25: qty 14, 7d supply, fill #0

## 2022-08-25 MED ORDER — FLUCONAZOLE 150 MG PO TABS
ORAL_TABLET | ORAL | 1 refills | Status: DC
  Filled 2022-08-25: qty 1, 1d supply, fill #0
  Filled 2022-08-29: qty 1, 1d supply, fill #1

## 2022-08-29 ENCOUNTER — Other Ambulatory Visit: Payer: Self-pay

## 2022-09-05 ENCOUNTER — Other Ambulatory Visit: Payer: Self-pay

## 2022-09-11 ENCOUNTER — Other Ambulatory Visit (HOSPITAL_COMMUNITY): Payer: Self-pay

## 2022-09-11 MED ORDER — PREDNISONE 10 MG PO TABS
ORAL_TABLET | ORAL | 0 refills | Status: AC
  Filled 2022-09-11: qty 21, 6d supply, fill #0

## 2022-09-11 MED ORDER — AZITHROMYCIN 500 MG PO TABS
500.0000 mg | ORAL_TABLET | Freq: Every day | ORAL | 0 refills | Status: DC
  Filled 2022-09-11: qty 5, 5d supply, fill #0

## 2022-09-11 MED ORDER — GUAIFENESIN-CODEINE 100-10 MG/5ML PO SOLN
5.0000 mL | Freq: Four times a day (QID) | ORAL | 0 refills | Status: DC
  Filled 2022-09-11: qty 120, 6d supply, fill #0

## 2022-09-17 ENCOUNTER — Other Ambulatory Visit: Payer: Self-pay

## 2022-09-17 MED ORDER — OXYCODONE-ACETAMINOPHEN 10-325 MG PO TABS
ORAL_TABLET | ORAL | 0 refills | Status: DC
  Filled 2022-09-19: qty 120, 30d supply, fill #0

## 2022-09-18 ENCOUNTER — Other Ambulatory Visit: Payer: Self-pay

## 2022-09-19 ENCOUNTER — Other Ambulatory Visit: Payer: Self-pay

## 2022-09-19 MED ORDER — SUMATRIPTAN SUCCINATE 100 MG PO TABS
ORAL_TABLET | ORAL | 5 refills | Status: DC
  Filled 2022-09-19: qty 9, 30d supply, fill #0
  Filled 2022-10-16: qty 9, 30d supply, fill #1

## 2022-09-24 ENCOUNTER — Other Ambulatory Visit: Payer: Self-pay

## 2022-09-26 ENCOUNTER — Other Ambulatory Visit (HOSPITAL_COMMUNITY): Payer: Self-pay

## 2022-09-26 ENCOUNTER — Other Ambulatory Visit: Payer: Self-pay

## 2022-09-26 DIAGNOSIS — G43109 Migraine with aura, not intractable, without status migrainosus: Secondary | ICD-10-CM | POA: Diagnosis not present

## 2022-09-26 DIAGNOSIS — I10 Essential (primary) hypertension: Secondary | ICD-10-CM | POA: Diagnosis not present

## 2022-09-26 DIAGNOSIS — E349 Endocrine disorder, unspecified: Secondary | ICD-10-CM | POA: Diagnosis not present

## 2022-09-26 DIAGNOSIS — Z0000 Encounter for general adult medical examination without abnormal findings: Secondary | ICD-10-CM | POA: Diagnosis not present

## 2022-09-26 DIAGNOSIS — E282 Polycystic ovarian syndrome: Secondary | ICD-10-CM | POA: Diagnosis not present

## 2022-09-26 DIAGNOSIS — Z9884 Bariatric surgery status: Secondary | ICD-10-CM | POA: Diagnosis not present

## 2022-09-26 MED ORDER — TRAZODONE HCL 100 MG PO TABS
150.0000 mg | ORAL_TABLET | Freq: Every day | ORAL | 1 refills | Status: DC
  Filled 2022-10-16: qty 135, 90d supply, fill #0
  Filled 2023-01-09: qty 135, 90d supply, fill #1

## 2022-09-26 MED ORDER — ALPRAZOLAM 0.5 MG PO TABS
ORAL_TABLET | ORAL | 1 refills | Status: DC
  Filled 2022-10-29: qty 270, 90d supply, fill #0
  Filled 2023-01-15 – 2023-01-26 (×2): qty 270, 90d supply, fill #1
  Filled 2023-01-27: qty 270, 90d supply, fill #0

## 2022-09-26 MED ORDER — TESTOSTERONE CYPIONATE 200 MG/ML IM SOLN
INTRAMUSCULAR | 0 refills | Status: DC
  Filled 2022-09-26: qty 1, 30d supply, fill #0
  Filled 2023-01-15 – 2023-01-19 (×2): qty 1, 30d supply, fill #1

## 2022-10-10 ENCOUNTER — Other Ambulatory Visit: Payer: Self-pay

## 2022-10-10 MED ORDER — PREDNISONE 10 MG PO TABS
ORAL_TABLET | ORAL | 0 refills | Status: AC
  Filled 2022-10-10: qty 26, 8d supply, fill #0

## 2022-10-10 MED ORDER — OXYCODONE-ACETAMINOPHEN 10-325 MG PO TABS
ORAL_TABLET | ORAL | 0 refills | Status: DC
  Filled 2022-10-10: qty 150, 30d supply, fill #0

## 2022-10-16 ENCOUNTER — Other Ambulatory Visit: Payer: Self-pay

## 2022-10-17 ENCOUNTER — Other Ambulatory Visit: Payer: Self-pay

## 2022-10-19 ENCOUNTER — Other Ambulatory Visit: Payer: Self-pay

## 2022-10-20 ENCOUNTER — Other Ambulatory Visit: Payer: Self-pay

## 2022-10-20 MED ORDER — EMGALITY 120 MG/ML ~~LOC~~ SOAJ
SUBCUTANEOUS | 11 refills | Status: DC
  Filled 2022-10-20: qty 1, 28d supply, fill #0
  Filled 2022-10-20: qty 1, 30d supply, fill #0
  Filled 2022-11-17: qty 1, 30d supply, fill #1
  Filled 2022-12-18: qty 1, 30d supply, fill #2
  Filled 2023-01-15: qty 1, 30d supply, fill #3
  Filled 2023-02-18: qty 1, 30d supply, fill #4
  Filled 2023-03-18: qty 1, 30d supply, fill #5
  Filled 2023-04-20: qty 1, 30d supply, fill #6
  Filled 2023-05-18: qty 1, 28d supply, fill #7

## 2022-10-20 MED ORDER — NURTEC 75 MG PO TBDP
ORAL_TABLET | ORAL | 6 refills | Status: DC
  Filled 2022-10-20: qty 16, 30d supply, fill #0
  Filled 2022-11-17: qty 16, 30d supply, fill #1
  Filled 2022-12-18: qty 16, 30d supply, fill #2
  Filled 2023-01-15 – 2023-01-19 (×2): qty 16, 30d supply, fill #3
  Filled 2023-02-18: qty 16, 30d supply, fill #4
  Filled 2023-03-18: qty 16, 30d supply, fill #5
  Filled 2023-04-20: qty 16, 30d supply, fill #6

## 2022-10-28 ENCOUNTER — Other Ambulatory Visit: Payer: Self-pay

## 2022-10-29 ENCOUNTER — Other Ambulatory Visit: Payer: Self-pay

## 2022-11-06 ENCOUNTER — Other Ambulatory Visit: Payer: Self-pay

## 2022-11-06 MED ORDER — OXYCODONE-ACETAMINOPHEN 10-325 MG PO TABS
1.0000 | ORAL_TABLET | ORAL | 0 refills | Status: DC | PRN
  Filled 2022-11-28: qty 150, 25d supply, fill #0

## 2022-11-06 MED ORDER — OXYCODONE-ACETAMINOPHEN 10-325 MG PO TABS
ORAL_TABLET | ORAL | 0 refills | Status: DC
  Filled 2022-11-06: qty 150, 25d supply, fill #0
  Filled ????-??-??: fill #0

## 2022-11-06 MED ORDER — PREDNISONE 10 MG PO TABS
ORAL_TABLET | ORAL | 0 refills | Status: DC
  Filled 2022-11-06: qty 26, 8d supply, fill #0

## 2022-11-08 ENCOUNTER — Telehealth: Payer: 59 | Admitting: Physician Assistant

## 2022-11-08 DIAGNOSIS — R0989 Other specified symptoms and signs involving the circulatory and respiratory systems: Secondary | ICD-10-CM

## 2022-11-08 MED ORDER — DOXYCYCLINE HYCLATE 100 MG PO TABS: 100.0000 mg | ORAL_TABLET | Freq: Two times a day (BID) | ORAL | 0 refills | Status: DC

## 2022-11-08 NOTE — Progress Notes (Signed)
E-Visit for Sinus Problems  We are sorry that you are not feeling well.  Here is how we plan to help!  Based on what you have shared with me it looks like you have sinusitis.  Sinusitis is inflammation and infection in the sinus cavities of the head.  Based on your presentation I believe you most likely have Acute Bacterial Sinusitis.  This is an infection caused by bacteria and is treated with antibiotics. I have prescribed Doxycycline 100mg  by mouth twice a day for 10 days. You may use an oral decongestant such as Mucinex D or if you have glaucoma or high blood pressure use plain Mucinex. Saline nasal spray help and can safely be used as often as needed for congestion.  If you develop worsening sinus pain, fever or notice severe headache and vision changes, or if symptoms are not better after completion of antibiotic, please schedule an appointment with a health care provider.    Sinus infections are not as easily transmitted as other respiratory infection, however we still recommend that you avoid close contact with loved ones, especially the very young and elderly.  Remember to wash your hands thoroughly throughout the day as this is the number one way to prevent the spread of infection!  Home Care: Only take medications as instructed by your medical team. Complete the entire course of an antibiotic. Do not take these medications with alcohol. A steam or ultrasonic humidifier can help congestion.  You can place a towel over your head and breathe in the steam from hot water coming from a faucet. Avoid close contacts especially the very young and the elderly. Cover your mouth when you cough or sneeze. Always remember to wash your hands.  Get Help Right Away If: You develop worsening fever or sinus pain. You develop a severe head ache or visual changes. Your symptoms persist after you have completed your treatment plan.  Make sure you Understand these instructions. Will watch your  condition. Will get help right away if you are not doing well or get worse.  Thank you for choosing an e-visit.  Your e-visit answers were reviewed by a board certified advanced clinical practitioner to complete your personal care plan. Depending upon the condition, your plan could have included both over the counter or prescription medications.  Please review your pharmacy choice. Make sure the pharmacy is open so you can pick up prescription now. If there is a problem, you may contact your provider through and have the prescription routed to another pharmacy.  Your safety is important to [employer]. If you have drug allergies check your prescription carefully.   For the next 24 hours you can use MyChart to ask questions about today's visit, request a non-urgent call back, or ask for a work or school excuse. You will get an email in the next two days asking about your experience. I hope that your e-visit has been valuable and will speed your recovery.   This appointment required 5-10 minutes of patient care (this includes precharting, chart review, review of results, face-to-face care, etc.).  [place] PA-C

## 2022-11-13 ENCOUNTER — Encounter: Payer: Self-pay | Admitting: *Deleted

## 2022-11-18 ENCOUNTER — Other Ambulatory Visit: Payer: Self-pay

## 2022-11-18 MED ORDER — SUMATRIPTAN SUCCINATE 100 MG PO TABS
ORAL_TABLET | ORAL | 5 refills | Status: DC
  Filled 2022-11-18: qty 9, 30d supply, fill #0
  Filled 2022-12-18: qty 9, 30d supply, fill #1
  Filled 2023-01-15 – 2023-01-19 (×2): qty 9, 30d supply, fill #2
  Filled 2023-02-18: qty 9, 30d supply, fill #3
  Filled 2023-03-18: qty 9, 30d supply, fill #4
  Filled 2023-04-20: qty 9, 30d supply, fill #5

## 2022-11-28 ENCOUNTER — Other Ambulatory Visit: Payer: Self-pay

## 2022-11-29 ENCOUNTER — Other Ambulatory Visit: Payer: Self-pay

## 2022-11-29 ENCOUNTER — Emergency Department (HOSPITAL_COMMUNITY): Payer: 59

## 2022-11-29 ENCOUNTER — Encounter (HOSPITAL_COMMUNITY): Payer: Self-pay

## 2022-11-29 ENCOUNTER — Emergency Department (HOSPITAL_COMMUNITY)
Admission: EM | Admit: 2022-11-29 | Discharge: 2022-11-29 | Disposition: A | Discharge status: Home / Self Care | Payer: 59 | Attending: Emergency Medicine | Admitting: Emergency Medicine

## 2022-11-29 ENCOUNTER — Telehealth: Payer: 59 | Admitting: Physician Assistant

## 2022-11-29 DIAGNOSIS — R6889 Other general symptoms and signs: Secondary | ICD-10-CM | POA: Diagnosis not present

## 2022-11-29 DIAGNOSIS — Z8616 Personal history of COVID-19: Secondary | ICD-10-CM | POA: Insufficient documentation

## 2022-11-29 DIAGNOSIS — R0789 Other chest pain: Secondary | ICD-10-CM | POA: Diagnosis not present

## 2022-11-29 DIAGNOSIS — Z1152 Encounter for screening for COVID-19: Secondary | ICD-10-CM | POA: Insufficient documentation

## 2022-11-29 DIAGNOSIS — R079 Chest pain, unspecified: Secondary | ICD-10-CM | POA: Diagnosis not present

## 2022-11-29 DIAGNOSIS — R509 Fever, unspecified: Secondary | ICD-10-CM | POA: Diagnosis not present

## 2022-11-29 DIAGNOSIS — J189 Pneumonia, unspecified organism: Secondary | ICD-10-CM | POA: Insufficient documentation

## 2022-11-29 DIAGNOSIS — M791 Myalgia, unspecified site: Secondary | ICD-10-CM

## 2022-11-29 DIAGNOSIS — R059 Cough, unspecified: Secondary | ICD-10-CM | POA: Diagnosis not present

## 2022-11-29 LAB — CBC WITH DIFFERENTIAL/PLATELET
Abs Immature Granulocytes: 0.13 10*3/uL — ABNORMAL HIGH (ref 0.00–0.07)
Basophils Absolute: 0 10*3/uL (ref 0.0–0.1)
Basophils Relative: 0 %
Eosinophils Absolute: 0 10*3/uL (ref 0.0–0.5)
Eosinophils Relative: 0 %
HCT: 38.1 % (ref 36.0–46.0)
Hemoglobin: 12.8 g/dL (ref 12.0–15.0)
Immature Granulocytes: 1 %
Lymphocytes Relative: 4 %
Lymphs Abs: 0.8 10*3/uL (ref 0.7–4.0)
MCH: 31.1 pg (ref 26.0–34.0)
MCHC: 33.6 g/dL (ref 30.0–36.0)
MCV: 92.5 fL (ref 80.0–100.0)
Monocytes Absolute: 0.5 10*3/uL (ref 0.1–1.0)
Monocytes Relative: 3 %
Neutro Abs: 16.6 10*3/uL — ABNORMAL HIGH (ref 1.7–7.7)
Neutrophils Relative %: 92 %
Platelets: 225 10*3/uL (ref 150–400)
RBC: 4.12 MIL/uL (ref 3.87–5.11)
RDW: 11.8 % (ref 11.5–15.5)
WBC: 18.1 10*3/uL — ABNORMAL HIGH (ref 4.0–10.5)
nRBC: 0 % (ref 0.0–0.2)

## 2022-11-29 LAB — COMPREHENSIVE METABOLIC PANEL
ALT: 19 U/L (ref 0–44)
AST: 25 U/L (ref 15–41)
Albumin: 4.3 g/dL (ref 3.5–5.0)
Alkaline Phosphatase: 46 U/L (ref 38–126)
Anion gap: 7 (ref 5–15)
BUN: 11 mg/dL (ref 6–20)
CO2: 25 mmol/L (ref 22–32)
Calcium: 8.8 mg/dL — ABNORMAL LOW (ref 8.9–10.3)
Chloride: 103 mmol/L (ref 98–111)
Creatinine, Ser: 0.58 mg/dL (ref 0.44–1.00)
GFR, Estimated: >60 mL/min (ref >60)
Glucose, Bld: 171 mg/dL — ABNORMAL HIGH (ref 70–99)
Potassium: 3.8 mmol/L (ref 3.5–5.1)
Sodium: 135 mmol/L (ref 135–145)
Total Bilirubin: 1.8 mg/dL — ABNORMAL HIGH (ref 0.3–1.2)
Total Protein: 6.8 g/dL (ref 6.5–8.1)

## 2022-11-29 LAB — RESP PANEL BY RT-PCR (RSV, FLU A&B, COVID)  RVPGX2
Influenza A by PCR: NEGATIVE
Influenza B by PCR: NEGATIVE
Resp Syncytial Virus by PCR: NEGATIVE
SARS Coronavirus 2 by RT PCR: NEGATIVE

## 2022-11-29 LAB — CK: Total CK: 59 U/L (ref 38–234)

## 2022-11-29 MED ORDER — HYDROCOD POLI-CHLORPHE POLI ER 10-8 MG/5ML PO SUER: 5.0000 mL | Freq: Two times a day (BID) | ORAL | 0 refills | Status: AC

## 2022-11-29 MED ORDER — HYDROMORPHONE HCL 1 MG/ML IJ SOLN
0.5000 mg | Freq: Once | INTRAMUSCULAR | Status: AC
  Administered 2022-11-29: 0.5 mg via INTRAVENOUS
  Filled 2022-11-29: qty 1

## 2022-11-29 MED ORDER — IOHEXOL 350 MG/ML SOLN
100.0000 mL | Freq: Once | INTRAVENOUS | Status: AC | PRN
  Administered 2022-11-29: 100 mL via INTRAVENOUS

## 2022-11-29 MED ORDER — PREDNISONE 20 MG PO TABS: 40.0000 mg | ORAL_TABLET | Freq: Every day | ORAL | 0 refills | Status: AC

## 2022-11-29 MED ORDER — ONDANSETRON HCL 4 MG/2ML IJ SOLN
4.0000 mg | Freq: Once | INTRAMUSCULAR | Status: AC
  Administered 2022-11-29: 4 mg via INTRAVENOUS
  Filled 2022-11-29: qty 2

## 2022-11-29 MED ORDER — OSELTAMIVIR PHOSPHATE 75 MG PO CAPS: 75.0000 mg | ORAL_CAPSULE | Freq: Two times a day (BID) | ORAL | 0 refills | Status: AC

## 2022-11-29 MED ORDER — SODIUM CHLORIDE 0.9 % IV BOLUS
1000.0000 mL | Freq: Once | INTRAVENOUS | Status: AC
  Administered 2022-11-29: 1000 mL via INTRAVENOUS

## 2022-11-29 MED ORDER — AMOXICILLIN-POT CLAVULANATE 875-125 MG PO TABS: 1.0000 | ORAL_TABLET | Freq: Two times a day (BID) | ORAL | 0 refills | Status: DC

## 2022-11-29 MED ORDER — MORPHINE SULFATE (PF) 4 MG/ML IV SOLN
4.0000 mg | Freq: Once | INTRAVENOUS | Status: AC
  Administered 2022-11-29: 4 mg via INTRAVENOUS
  Filled 2022-11-29: qty 1

## 2022-11-29 MED ORDER — SODIUM CHLORIDE 0.9 % IV SOLN
1.0000 g | Freq: Once | INTRAVENOUS | Status: AC
  Administered 2022-11-29: 1 g via INTRAVENOUS
  Filled 2022-11-29: qty 10

## 2022-11-29 NOTE — Discharge Instructions (Addendum)
  Get help right away if: Your shortness of breath becomes worse. Your chest pain increases. Your sickness becomes worse, especially if you are an older adult or have a weak immune system. You cough up blood. 

## 2022-11-29 NOTE — ED Provider Notes (Incomplete)
[facility]-EMERGENCY DEPT Provider Note   CSN: [id number] Arrival date & time: 11/29/22  1453     History {Add pertinent medical, surgical, social history, OB history to HPI:1} Chief Complaint  Patient presents with   Fever    Victoria Silva is a 41 y.o. female who presents emergency department with chief complaint of pleuritic chest pain and flulike illness.  Patient states that she awoke today with a fever of 104, 7 out of 10 sharp right-sided chest pain.  She took Tylenol and ibuprofen prior to arrival for reduction of fever.  She has a past medical history of bariatric surgery and generally does not take anti-inflammatory medications.  She also noted about an hour and a half of numbness of the right hand which has resolved and complains of pain in her right hip which radiates into the thigh and is aching and constant.  All the symptoms are new since this morning.   Fever      Home Medications Prior to Admission medications   Medication Sig Start Date End Date Taking? Authorizing Provider  albuterol (VENTOLIN HFA) 108 (90 Base) MCG/ACT inhaler Inhale 2 inhalations into the lungs every 4 (four) hours as needed for Wheezing 05/13/22     ALPRAZolam (XANAX) 0.5 MG tablet Take 1 tablet (0.5 mg total) by mouth 3 (three) times daily as needed for Sleep or Anxiety 09/26/22     doxycycline (VIBRA-TABS) 100 MG tablet Take 1 tablet (100 mg total) by mouth 2 (two) times daily. 11/08/22   Jarold Motto, PA  gabapentin (NEURONTIN) 300 MG capsule Take 1 capsule (300 mg total) by mouth at bedtime 03/14/22     Galcanezumab-gnlm (EMGALITY) 120 MG/ML SOAJ Inject 120 mg subcutaneously every 28 (twenty-eight) days. Start 1 month after loading dose 10/20/22     ondansetron (ZOFRAN-ODT) 4 MG disintegrating tablet DISSOLVE 1 TABLET BY MOUTH EVERY 8 HOURS AS NEEDED FOR NAUSEA OR VOMITING 04/28/22     ondansetron (ZOFRAN-ODT) 4 MG disintegrating tablet TAKE 1 TABLET BY MOUTH  EVERY 8 HOURS AS NEEDED FOR NAUSEA OR VOMITING FOR UP TO 30 DAYS 08/12/22     oseltamivir (TAMIFLU) 75 MG capsule Take 1 capsule (75 mg total) by mouth 2 (two) times daily for 5 days. 11/29/22 12/04/22  Ward, Tylene Fantasia, PA-C  oxyCODONE-acetaminophen (PERCOCET) 10-325 MG tablet Take 1 tablet by mouth every 4 (four) hours as needed for Pain for up to 30 days 10/10/22     oxyCODONE-acetaminophen (PERCOCET) 10-325 MG tablet Take 1 tablet by mouth every 4 (four) hours as needed for Pain for up to 30 days 11/07/22     oxyCODONE-acetaminophen (PERCOCET) 10-325 MG tablet Take 1 tablet by mouth every 4 (four) hours as needed for Pain for up to 30 days 11/06/22     predniSONE (DELTASONE) 10 MG tablet Taper 6-6-4-4-2-2-1-1-off 11/06/22     predniSONE (DELTASONE) 20 MG tablet Take 2 tablets (40 mg total) by mouth daily with breakfast for 5 days. 11/29/22 12/04/22  Ward, Tylene Fantasia, PA-C  Rimegepant Sulfate (NURTEC) 75 MG TBDP Take 1 tablet (75 mg total) by mouth as directed Maximum dose in 24 hours is 1 tablet (75 mg) 10/20/22     SUMAtriptan (IMITREX) 100 MG tablet Take 1 tablet (100 mg total) by mouth as directed for Migraine. May take a second dose after 2 hours if needed. 11/18/22     testosterone cypionate (DEPOTESTOSTERONE CYPIONATE) 200 MG/ML injection Inject 0.13 mLs (26 mg total) into the muscle every 28 (  twenty-eight) days for 30 days Called to pharmacy 09/26/22     tiZANidine (ZANAFLEX) 4 MG tablet TAKE 1 TABLET (4 MG TOTAL) BY MOUTH EVERY 8 (EIGHT) HOURS AS NEEDED 02/22/22     tiZANidine (ZANAFLEX) 4 MG tablet Take 1 tablet (4 mg total) by mouth every 8 (eight) hours as needed 02/24/22     traMADol (ULTRAM) 50 MG tablet 1-2 po tid prn 07/02/22     traMADol (ULTRAM) 50 MG tablet 1-2 po tid prn 07/17/22     traZODone (DESYREL) 100 MG tablet Take 1.5 tablets (150 mg total) by mouth at bedtime. 09/26/22     bisoprolol-hydrochlorothiazide (ZIAC) 5-6.25 MG tablet Take 1 tablet by mouth daily.  01/06/17 11/26/20  [provider]  pantoprazole (PROTONIX) 40 MG tablet Take 1 tablet (40 mg total) by mouth daily. 10/27/18 11/26/20  Luretha Murphy, MD  triamterene-hydrochlorothiazide (DYAZIDE) 37.5-25 MG capsule Take 1 capsule by mouth daily.   11/26/20  [provider]      Allergies    Patient has no known allergies.    Review of Systems   Review of Systems  Constitutional:  Positive for fever.    Physical Exam Updated Vital Signs BP 123/85 (BP Location: Left Arm)   Pulse (!) 105   Temp 98.3 F (36.8 C)   Resp 19   Ht 5' 1.5" (1.562 m)   Wt 68 kg   SpO2 98%   BMI 27.88 kg/m  Physical Exam Vitals and nursing note reviewed.  Constitutional:      General: She is not in acute distress.    Appearance: She is well-developed. She is ill-appearing. She is not toxic-appearing or diaphoretic.  HENT:     Head: Normocephalic and atraumatic.     Right Ear: External ear normal.     Left Ear: External ear normal.     Nose: Nose normal.     Mouth/Throat:     Mouth: Mucous membranes are moist.  Eyes:     General: No scleral icterus.    Conjunctiva/sclera: Conjunctivae normal.  Cardiovascular:     Rate and Rhythm: Normal rate and regular rhythm.     Heart sounds: Normal heart sounds. No murmur heard.    No friction rub. No gallop.  Pulmonary:     Effort: Pulmonary effort is normal. No respiratory distress.     Breath sounds: Normal breath sounds.  Abdominal:     General: Bowel sounds are normal. There is no distension.     Palpations: Abdomen is soft. There is no mass.     Tenderness: There is no abdominal tenderness. There is no guarding.  Musculoskeletal:     Cervical back: Normal range of motion.  Skin:    General: Skin is warm and dry.  Neurological:     Mental Status: She is alert and oriented to person, place, and time.  Psychiatric:        Behavior: Behavior normal.     ED Results / Procedures / Treatments   Labs (all labs ordered are listed, but only abnormal  results are displayed) Labs Reviewed - No data to display  EKG None  Radiology No results found.  Procedures Procedures  {Document cardiac monitor, telemetry assessment procedure when appropriate:1}  Medications Ordered in ED Medications - No data to display  ED Course/ Medical Decision Making/ A&P Clinical Course as of 11/29/22 2016  Sat Nov 29, 2022  1603 DG Chest Port 1 View [AH]  1611 WBC(!): 18.1 [AH]  1611  EKG shows sinus rhythm at a rate of 85  [AH]  1645 Glucose(!): 171 [AH]  1645 Total Bilirubin(!): 1.8 [AH]    Clinical Course User Index [AH] Arthor Captain, PA-C                           Medical Decision Making Patient here with c/o fever and cp. The emergent differential diagnosis of chest pain includes: Acute coronary syndrome, pericarditis, aortic dissection, pulmonary embolism, tension pneumothorax, pneumonia, and esophageal rupture.  After review of all symptoms the patient appears to have early CAP. She reports that she aspirated some fluid a few days ago and is concerned it may be an aspiration pneumonia. Given this part of the history will cover with augmentin.      Amount and/or Complexity of Data Reviewed Labs: ordered. Decision-making details documented in ED Course. Radiology: ordered. Decision-making details documented in ED Course.  Risk Prescription drug management.   ***  {Document critical care time when appropriate:1} {Document review of labs and clinical decision tools ie heart score, Chads2Vasc2 etc:1}  {Document your independent review of radiology images, and any outside records:1} {Document your discussion with family members, caretakers, and with consultants:1} {Document social determinants of health affecting pt's care:1} {Document your decision making why or why not admission, treatments were needed:1} Final Clinical Impression(s) / ED Diagnoses Final diagnoses:  None    Rx / DC Orders ED Discharge Orders     None

## 2022-11-29 NOTE — ED Triage Notes (Signed)
Pt arrived via POV, woke with right sided chest pain with breathing or movement. Febrile at 104 this morning. Home COVID test was negative. Denies any SOB.

## 2022-11-29 NOTE — Progress Notes (Signed)
E visit for Flu like symptoms   We are sorry that you are not feeling well.  Here is how we plan to help! Based on what you have shared with me it looks like you may have a respiratory virus that may be influenza.  Influenza or "the flu" is   an infection caused by a respiratory virus. The flu virus is highly contagious and persons who did not receive their yearly flu vaccination may "catch" the flu from close contact.  We have anti-viral medications to treat the viruses that cause this infection. They are not a "cure" and only shorten the course of the infection. These prescriptions are most effective when they are given within the first 2 days of "flu" symptoms. Antiviral medication are indicated if you have a high risk of complications from the flu. You should  also consider an antiviral medication if you are in close contact with someone who is at risk. These medications can help patients avoid complications from the flu  but have side effects that you should know. Possible side effects from Tamiflu or oseltamivir include nausea, vomiting, diarrhea, dizziness, headaches, eye redness, sleep problems or other respiratory symptoms. You should not take Tamiflu if you have an allergy to oseltamivir or any to the ingredients in Tamiflu.  Based upon your symptoms and potential risk factors I have prescribed Oseltamivir (Tamiflu).  It has been sent to your designated pharmacy.  You will take one 75 mg capsule orally twice a day for the next 5 days. and I recommend that you follow the flu symptoms recommendation that I have listed below.  I have also sent in a course of prednisone given your asthma flare.   ANYONE WHO HAS FLU SYMPTOMS SHOULD: Stay home. The flu is highly contagious and going out or to work exposes others! Be sure to drink plenty of fluids. Water is fine as well as fruit juices, sodas and electrolyte beverages. You may want to stay away from caffeine or alcohol. If you are nauseated, try  taking small sips of liquids. How do you know if you are getting enough fluid? Your urine should be a pale yellow or almost colorless. Get rest. Taking a steamy shower or using a humidifier may help nasal congestion and ease sore throat pain. Using a saline nasal spray works much the same way. Cough drops, hard candies and sore throat lozenges may ease your cough. Line up a caregiver. Have someone check on you regularly.   GET HELP RIGHT AWAY IF: You cannot keep down liquids or your medications. You become short of breath Your fell like you are going to pass out or loose consciousness. Your symptoms persist after you have completed your treatment plan MAKE SURE YOU  Understand these instructions. Will watch your condition. Will get help right away if you are not doing well or get worse.  Your e-visit answers were reviewed by a board certified advanced clinical practitioner to complete your personal care plan.  Depending on the condition, your plan could have included both over the counter or prescription medications.  If there is a problem please reply  once you have received a response from your provider.  Your safety is important to [place].  If you have drug allergies check your prescription carefully.    You can use MyChart to ask questions about today's visit, request a non-urgent call back, or ask for a work or school excuse for 24 hours related to this e-Visit. If it has been greater  than 24 hours you will need to follow up with your provider, or enter a new e-Visit to address those concerns.  You will get an e-mail in the next two days asking about your experience.  I hope that your e-visit has been valuable and will speed your recovery. Thank you for using e-visits.   I have spent 5 minutes in review of e-visit questionnaire, review and updating patient chart, medical decision making and response to patient.   Tylene Fantasia Ward, PA-C

## 2022-12-04 ENCOUNTER — Other Ambulatory Visit: Payer: Self-pay

## 2022-12-16 DIAGNOSIS — G8929 Other chronic pain: Secondary | ICD-10-CM | POA: Diagnosis not present

## 2022-12-16 DIAGNOSIS — R109 Unspecified abdominal pain: Secondary | ICD-10-CM | POA: Diagnosis not present

## 2022-12-16 DIAGNOSIS — M545 Low back pain, unspecified: Secondary | ICD-10-CM | POA: Diagnosis not present

## 2022-12-17 ENCOUNTER — Other Ambulatory Visit: Payer: Self-pay | Admitting: Internal Medicine

## 2022-12-17 DIAGNOSIS — R109 Unspecified abdominal pain: Secondary | ICD-10-CM

## 2022-12-18 ENCOUNTER — Other Ambulatory Visit: Payer: Self-pay

## 2022-12-18 MED ORDER — ONDANSETRON 4 MG PO TBDP
4.0000 mg | ORAL_TABLET | Freq: Three times a day (TID) | ORAL | 5 refills | Status: DC | PRN
  Filled 2022-12-18: qty 30, 10d supply, fill #0
  Filled 2023-01-06: qty 30, 10d supply, fill #1
  Filled 2023-02-09: qty 30, 10d supply, fill #2
  Filled 2023-02-26: qty 30, 10d supply, fill #3
  Filled 2023-03-04 – 2023-03-10 (×2): qty 30, 10d supply, fill #4
  Filled 2023-03-30: qty 30, 10d supply, fill #5

## 2022-12-19 ENCOUNTER — Other Ambulatory Visit: Payer: Self-pay

## 2022-12-19 MED ORDER — OXYCODONE-ACETAMINOPHEN 10-325 MG PO TABS
ORAL_TABLET | ORAL | 0 refills | Status: DC
  Filled 2022-12-23: qty 150, 30d supply, fill #0

## 2022-12-23 ENCOUNTER — Other Ambulatory Visit: Payer: Self-pay

## 2022-12-24 ENCOUNTER — Ambulatory Visit: Payer: Commercial Managed Care - PPO

## 2023-01-06 ENCOUNTER — Other Ambulatory Visit: Payer: Self-pay

## 2023-01-15 ENCOUNTER — Other Ambulatory Visit: Payer: Self-pay

## 2023-01-15 MED ORDER — OXYCODONE-ACETAMINOPHEN 10-325 MG PO TABS
1.0000 | ORAL_TABLET | ORAL | 0 refills | Status: DC | PRN
  Filled 2023-01-16: qty 150, 25d supply, fill #0

## 2023-01-16 ENCOUNTER — Other Ambulatory Visit: Payer: Self-pay

## 2023-01-19 ENCOUNTER — Other Ambulatory Visit: Payer: Self-pay

## 2023-01-20 ENCOUNTER — Other Ambulatory Visit: Payer: Self-pay

## 2023-01-21 ENCOUNTER — Other Ambulatory Visit: Payer: Self-pay

## 2023-01-22 ENCOUNTER — Other Ambulatory Visit: Payer: Self-pay

## 2023-01-26 ENCOUNTER — Other Ambulatory Visit: Payer: Self-pay

## 2023-01-27 ENCOUNTER — Other Ambulatory Visit (HOSPITAL_COMMUNITY): Payer: Self-pay

## 2023-01-30 ENCOUNTER — Other Ambulatory Visit (HOSPITAL_COMMUNITY): Payer: Self-pay

## 2023-02-03 ENCOUNTER — Other Ambulatory Visit: Payer: Self-pay

## 2023-02-03 MED ORDER — CEPHALEXIN 500 MG PO CAPS
500.0000 mg | ORAL_CAPSULE | Freq: Four times a day (QID) | ORAL | 0 refills | Status: DC
  Filled 2023-02-03: qty 28, 7d supply, fill #0

## 2023-02-09 ENCOUNTER — Other Ambulatory Visit: Payer: Self-pay

## 2023-02-09 MED ORDER — OXYCODONE-ACETAMINOPHEN 10-325 MG PO TABS
1.0000 | ORAL_TABLET | ORAL | 0 refills | Status: DC | PRN
  Filled 2023-02-09: qty 150, 25d supply, fill #0

## 2023-02-10 ENCOUNTER — Other Ambulatory Visit: Payer: Self-pay

## 2023-02-18 ENCOUNTER — Other Ambulatory Visit: Payer: Self-pay

## 2023-02-23 ENCOUNTER — Other Ambulatory Visit (HOSPITAL_COMMUNITY): Payer: Self-pay

## 2023-02-23 DIAGNOSIS — H5203 Hypermetropia, bilateral: Secondary | ICD-10-CM | POA: Diagnosis not present

## 2023-02-23 MED ORDER — PREDNISONE 10 MG (21) PO TBPK
ORAL_TABLET | ORAL | 0 refills | Status: DC
  Filled 2023-02-23: qty 21, 6d supply, fill #0

## 2023-02-23 MED ORDER — HYDROCOD POLI-CHLORPHE POLI ER 10-8 MG/5ML PO SUER
5.0000 mL | Freq: Every evening | ORAL | 0 refills | Status: DC | PRN
  Filled 2023-02-23: qty 120, 24d supply, fill #0

## 2023-02-23 MED ORDER — AZITHROMYCIN 250 MG PO TABS
ORAL_TABLET | ORAL | 0 refills | Status: DC
  Filled 2023-02-23: qty 6, 5d supply, fill #0

## 2023-02-26 ENCOUNTER — Other Ambulatory Visit: Payer: Self-pay

## 2023-03-03 ENCOUNTER — Other Ambulatory Visit: Payer: Self-pay

## 2023-03-03 MED ORDER — OXYCODONE-ACETAMINOPHEN 10-325 MG PO TABS
1.0000 | ORAL_TABLET | ORAL | 0 refills | Status: DC | PRN
  Filled 2023-03-05: qty 42, 7d supply, fill #0

## 2023-03-04 ENCOUNTER — Other Ambulatory Visit: Payer: Self-pay

## 2023-03-05 ENCOUNTER — Other Ambulatory Visit: Payer: Self-pay

## 2023-03-10 ENCOUNTER — Other Ambulatory Visit: Payer: Self-pay

## 2023-03-10 MED ORDER — OXYCODONE-ACETAMINOPHEN 10-325 MG PO TABS
1.0000 | ORAL_TABLET | ORAL | 0 refills | Status: DC | PRN
  Filled 2023-03-11: qty 150, 25d supply, fill #0

## 2023-03-11 ENCOUNTER — Other Ambulatory Visit: Payer: Self-pay

## 2023-03-11 ENCOUNTER — Encounter: Payer: Self-pay | Admitting: Dermatology

## 2023-03-11 ENCOUNTER — Ambulatory Visit: Payer: Commercial Managed Care - PPO | Admitting: Dermatology

## 2023-03-11 VITALS — BP 111/74

## 2023-03-11 DIAGNOSIS — L814 Other melanin hyperpigmentation: Secondary | ICD-10-CM | POA: Diagnosis not present

## 2023-03-11 DIAGNOSIS — L578 Other skin changes due to chronic exposure to nonionizing radiation: Secondary | ICD-10-CM

## 2023-03-11 DIAGNOSIS — D229 Melanocytic nevi, unspecified: Secondary | ICD-10-CM | POA: Diagnosis not present

## 2023-03-11 DIAGNOSIS — L409 Psoriasis, unspecified: Secondary | ICD-10-CM

## 2023-03-11 DIAGNOSIS — L821 Other seborrheic keratosis: Secondary | ICD-10-CM | POA: Diagnosis not present

## 2023-03-11 DIAGNOSIS — D485 Neoplasm of uncertain behavior of skin: Secondary | ICD-10-CM

## 2023-03-11 DIAGNOSIS — Z1283 Encounter for screening for malignant neoplasm of skin: Secondary | ICD-10-CM

## 2023-03-11 DIAGNOSIS — B078 Other viral warts: Secondary | ICD-10-CM | POA: Diagnosis not present

## 2023-03-11 DIAGNOSIS — Z79899 Other long term (current) drug therapy: Secondary | ICD-10-CM | POA: Diagnosis not present

## 2023-03-11 DIAGNOSIS — Z7189 Other specified counseling: Secondary | ICD-10-CM | POA: Diagnosis not present

## 2023-03-11 MED ORDER — GABAPENTIN 300 MG PO CAPS
300.0000 mg | ORAL_CAPSULE | Freq: Two times a day (BID) | ORAL | 3 refills | Status: DC
  Filled 2023-03-11: qty 180, 90d supply, fill #0
  Filled 2023-05-19: qty 180, 90d supply, fill #1

## 2023-03-11 MED ORDER — PREDNISONE 10 MG PO TABS
ORAL_TABLET | ORAL | 0 refills | Status: DC
  Filled 2023-03-11: qty 26, 8d supply, fill #0

## 2023-03-11 MED ORDER — MOMETASONE FUROATE 0.1 % EX SOLN
Freq: Every day | CUTANEOUS | 2 refills | Status: DC
  Filled 2023-03-11: qty 60, 30d supply, fill #0
  Filled 2023-04-05: qty 60, 30d supply, fill #1
  Filled 2023-05-18: qty 60, 30d supply, fill #2

## 2023-03-11 NOTE — Patient Instructions (Addendum)
Cryotherapy Aftercare  Wash gently with soap and water everyday.   Apply Vaseline and Band-Aid daily until healed.   Shave Excision Benign Lesion Wound Care Instructions  Leave the original bandage on for 24 hours if possible.  If the bandage becomes soaked or soiled before that time, it is OK to remove it and examine the wound.  A small amount of post-operative bleeding is normal.  If excessive bleeding occurs, remove the bandage, place gauze over the site and apply continuous pressure (no peeking) over the area for 20-30 minutes.  If this does not stop the bleeding, try again for 40 minutes.  If this does not work, please call our clinic as soon as possible (even if after-hours).    Twice a day, cleanse the wound with soap and water.  If a thick crust develops you may use a Q-tip dipped into dilute hydrogen peroxide (mix 1:1 with water) to dissolve it.  Hydrogen peroxide can slow the healing process, so use it only as needed.  After washing, apply Vaseline jelly or Polysporin ointment.  For best healing, the wound should be covered with a layer of ointment at all times.  This may mean re-applying the ointment several times a day.  For open wounds, continue until it has healed.    If you have any swelling, keep the area elevated.  Some redness, tenderness and white or yellow material in the wound is normal healing.  If the area becomes very sore and red, or develops a thick yellow-green material (pus), it may be infected; please notify us.    Wound healing continues for up to one year following surgery.  It is not unusual to experience pain in the scar from time to time during the interval.  If the pain becomes severe or the scar thickens, you should notify the office.  A slight amount of redness in a scar is expected for the first six months.  After six months, the redness subsides and the scar will soften and fade.  The color difference becomes less noticeable with time.  If there are any problems,  return for a post-op surgery check at your earliest convenience.  Please call our office for any questions or concerns.    Due to recent changes in healthcare laws, you may see results of your pathology and/or laboratory studies on MyChart before the doctors have had a chance to review them. We understand that in some cases there may be results that are confusing or concerning to you. Please understand that not all results are received at the same time and often the doctors may need to interpret multiple results in order to provide you with the best plan of care or course of treatment. Therefore, we ask that you please give us 2 business days to thoroughly review all your results before contacting the office for clarification. Should we see a critical lab result, you will be contacted sooner.   If You Need Anything After Your Visit  If you have any questions or concerns for your doctor, please call our main line at [phone] and press option 4 to reach your doctor's medical assistant. If no one answers, please leave a voicemail as directed and we will return your call as soon as possible. Messages left after 4 pm will be answered the following business day.   You may also send us a message via MyChart. We typically respond to MyChart messages within 1-2 business days.  For prescription refills, please ask your   pharmacy to contact our office. Our fax number is [phone].  If you have an urgent issue when the clinic is closed that cannot wait until the next business day, you can page your doctor at the number below.    Please note that while we do our best to be available for urgent issues outside of office hours, we are not available 24/7.   If you have an urgent issue and are unable to reach us, you may choose to seek medical care at your doctor's office, retail clinic, urgent care center, or emergency room.  If you have a medical emergency, please immediately call 911 or go to the  emergency department.  Pager Numbers  - Dr. Kowalski: [phone]  - Dr. Moye: [phone]  - Dr. Stewart: [phone]  In the event of inclement weather, please call our main line at [phone] for an update on the status of any delays or closures.  Dermatology Medication Tips: Please keep the boxes that topical medications come in in order to help keep track of the instructions about where and how to use these. Pharmacies typically print the medication instructions only on the boxes and not directly on the medication tubes.   If your medication is too expensive, please contact our office at [phone] option 4 or send us a message through MyChart.   We are unable to tell what your co-pay for medications will be in advance as this is different depending on your insurance coverage. However, we may be able to find a substitute medication at lower cost or fill out paperwork to get insurance to cover a needed medication.   If a prior authorization is required to get your medication covered by your insurance company, please allow us 1-2 business days to complete this process.  Drug prices often vary depending on where the prescription is filled and some pharmacies may offer cheaper prices.  The website www.goodrx.com contains coupons for medications through different pharmacies. The prices here do not account for what the cost may be with help from insurance (it may be cheaper with your insurance), but the website can give you the price if you did not use any insurance.  - You can print the associated coupon and take it with your prescription to the pharmacy.  - You may also stop by our office during regular business hours and pick up a GoodRx coupon card.  - If you need your prescription sent electronically to a different pharmacy, notify our office through [facility] MyChart or by phone at [phone] option 4.     Si Usted Necesita Algo Despus de Su Visita  Tambin puede  enviarnos un mensaje a travs de MyChart. Por lo general respondemos a los mensajes de MyChart en el transcurso de 1 a 2 das hbiles.  Para renovar recetas, por favor pida a su farmacia que se ponga en contacto con nuestra oficina. Nuestro nmero de fax es el [phone].  Si tiene un asunto urgente cuando la clnica est cerrada y que no puede esperar hasta el siguiente da hbil, puede llamar/localizar a su doctor(a) al nmero que aparece a continuacin.   Por favor, tenga en cuenta que aunque hacemos todo lo posible para estar disponibles para asuntos urgentes fuera del horario de oficina, no estamos disponibles las 24 horas del da, los 7 das de la semana.   Si tiene un problema urgente y no puede comunicarse con nosotros, puede optar por buscar atencin mdica  en el consultorio de su doctor(a), en   una clnica privada, en un centro de atencin urgente o en una sala de emergencias.  Si tiene una emergencia mdica, por favor llame inmediatamente al 911 o vaya a la sala de emergencias.  Nmeros de bper  - Dr. Kowalski: [phone]  - Dra. Moye: [phone]  - Dra. Stewart: [phone]  En caso de inclemencias del tiempo, por favor llame a nuestra lnea principal al [phone] para una actualizacin sobre el estado de cualquier retraso o cierre.  Consejos para la medicacin en dermatologa: Por favor, guarde las cajas en las que vienen los medicamentos de uso tpico para ayudarle a seguir las instrucciones sobre dnde y cmo usarlos. Las farmacias generalmente imprimen las instrucciones del medicamento slo en las cajas y no directamente en los tubos del medicamento.   Si su medicamento es muy caro, por favor, pngase en contacto con nuestra oficina llamando al [phone] y presione la opcin 4 o envenos un mensaje a travs de MyChart.   No podemos decirle cul ser su copago por los medicamentos por adelantado ya que esto es diferente dependiendo de la cobertura de su seguro.  Sin embargo, es posible que podamos encontrar un medicamento sustituto a menor costo o llenar un formulario para que el seguro cubra el medicamento que se considera necesario.   Si se requiere una autorizacin previa para que su compaa de seguros cubra su medicamento, por favor permtanos de 1 a 2 das hbiles para completar este proceso.  Los precios de los medicamentos varan con frecuencia dependiendo del lugar de dnde se surte la receta y alguna farmacias pueden ofrecer precios ms baratos.  El sitio web www.goodrx.com tiene cupones para medicamentos de diferentes farmacias. Los precios aqu no tienen en cuenta lo que podra costar con la ayuda del seguro (puede ser ms barato con su seguro), pero el sitio web puede darle el precio si no utiliz ningn seguro.  - Puede imprimir el cupn correspondiente y llevarlo con su receta a la farmacia.  - Tambin puede pasar por nuestra oficina durante el horario de atencin regular y recoger una tarjeta de cupones de GoodRx.  - Si necesita que su receta se enve electrnicamente a una farmacia diferente, informe a nuestra oficina a travs de MyChart de [place] o por telfono llamando al [phone] y presione la opcin 4.  

## 2023-03-11 NOTE — Progress Notes (Signed)
New Patient Visit   Subjective  Victoria Silva is a 42 y.o. female who presents for the following: Skin Cancer Screening and Full Body Skin Exam - She has a wart of left ankle that has been treated with LN2 in the past, spot of right cheek that just came up and skin tag of left post thigh that gets irritated. She also has a scaly patch that is itchy on her right post scalp. The patient presents for Total-Body Skin Exam (TBSE) for skin cancer screening and mole check. The patient has spots, moles and lesions to be evaluated, some may be new or changing and the patient has concerns that these could be cancer.  The following portions of the chart were reviewed this encounter and updated as appropriate: medications, allergies, medical history  Review of Systems:  No other skin or systemic complaints except as noted in HPI or Assessment and Plan.  Objective  Well appearing patient in no apparent distress; mood and affect are within normal limits.  A full examination was performed including scalp, head, eyes, ears, nose, lips, neck, chest, axillae, abdomen, back, buttocks, bilateral upper extremities, bilateral lower extremities, hands, feet, fingers, toes, fingernails, and toenails. All findings within normal limits unless otherwise noted below.   Relevant physical exam findings are noted in the Assessment and Plan.  Left scapula Slightly irregular brown macule 0.6 cm  Left proximal post medial thigh at inf buttocks crease Flesh colored papule 0.7 cm   Assessment & Plan   LENTIGINES, SEBORRHEIC KERATOSES, HEMANGIOMAS - Benign normal skin lesions - Benign-appearing - Call for any changes  MELANOCYTIC NEVI - Tan-brown and/or pink-flesh-colored symmetric macules and papules - Benign appearing on exam today - Observation - Call clinic for new or changing moles - Recommend daily use of broad spectrum spf 30+ sunscreen to sun-exposed areas.   ACTINIC DAMAGE - Chronic condition,  secondary to cumulative UV/sun exposure - diffuse scaly erythematous macules with underlying dyspigmentation - Recommend daily broad spectrum sunscreen SPF 30+ to sun-exposed areas, reapply every 2 hours as needed.  - Staying in the shade or wearing long sleeves, sun glasses (UVA+UVB protection) and wide brim hats (4-inch brim around the entire circumference of the hat) are also recommended for sun protection.  - Call for new or changing lesions.  SKIN CANCER SCREENING PERFORMED TODAY.  PSORIASIS Exam: Pink scaly patch of right post scalp. Pink patch of left wrist. Chronic and persistent condition with duration or expected duration over one year. Condition is symptomatic / bothersome to patient. Not to goal. Psoriasis is a chronic non-curable, but treatable genetic/hereditary disease that may have other systemic features affecting other organ systems such as joints (Psoriatic Arthritis). It is associated with an increased risk of inflammatory bowel disease, heart disease, non-alcoholic fatty liver disease, and depression.  Treatments include light and laser treatments; topical medications; and systemic medications including oral and injectables.  Treatment Plan: Mometasone lotion qd up to 5 days per week to affected areas  LENTIGO with slight hypopigmentation Exam: Light brown macule with surrounding hypopigmentation. Treatment Plan: Benign appearing, observe. No treatment recommended at this time.  Neoplasm of uncertain behavior of skin (2) Left scapula  Epidermal / dermal shaving  Lesion diameter (cm):  0.6 Informed consent: discussed and consent obtained   Timeout: patient name, date of birth, surgical site, and procedure verified   Procedure prep:  Patient was prepped and draped in usual sterile fashion Prep type:  Isopropyl alcohol Anesthesia: the lesion was anesthetized in a  standard fashion   Anesthetic:  1% lidocaine w/ epinephrine 1-100,000 buffered w/ 8.4% NaHCO3 Instrument  used: flexible razor blade   Hemostasis achieved with: pressure, aluminum chloride and electrodesiccation   Outcome: patient tolerated procedure well   Post-procedure details: sterile dressing applied and wound care instructions given   Dressing type: bandage and petrolatum    Specimen 1 - Surgical pathology Differential Diagnosis: Nevus vs dysplastic nevus  Check Margins: No  Left proximal post medial thigh at inf buttocks crease  Epidermal / dermal shaving  Lesion diameter (cm):  0.7 Informed consent: discussed and consent obtained   Timeout: patient name, date of birth, surgical site, and procedure verified   Procedure prep:  Patient was prepped and draped in usual sterile fashion Prep type:  Isopropyl alcohol Anesthesia: the lesion was anesthetized in a standard fashion   Anesthetic:  1% lidocaine w/ epinephrine 1-100,000 buffered w/ 8.4% NaHCO3 Instrument used: flexible razor blade   Hemostasis achieved with: pressure, aluminum chloride and electrodesiccation   Outcome: patient tolerated procedure well   Post-procedure details: sterile dressing applied and wound care instructions given   Dressing type: bandage and petrolatum    Specimen 2 - Surgical pathology Differential Diagnosis: Nevus vs skin tag R/O dysplasia Check Margins: No  WART Exam: verrucous papule 0.6 x 0.4 cm of left ankle  Discussed viral / HPV (Human Papilloma Virus) etiology and risk of spread /infectivity to other areas of body as well as to other people.  Multiple treatments and methods may be required to clear warts and it is possible treatment may not be successful.  Treatment risks include discoloration; scarring and there is still potential for wart recurrence.  Treatment Plan: Destruction Procedure Note Destruction method: cryotherapy   Informed consent: discussed and consent obtained   Lesion destroyed using liquid nitrogen: Yes   Outcome: patient tolerated procedure well with no complications    Post-procedure details: wound care instructions given   Locations: left ankle # of Lesions Treated: 1  Prior to procedure, discussed risks of blister formation, small wound, skin dyspigmentation, or rare scar following cryotherapy. Recommend Vaseline ointment to treated areas while healing.  Return for Psoriasis.  I, Joanie Coddington, CMA, am acting as scribe for Armida Sans, MD .  Documentation: I have reviewed the above documentation for accuracy and completeness, and I agree with the above.  Armida Sans, MD

## 2023-03-16 ENCOUNTER — Telehealth: Payer: Self-pay

## 2023-03-16 NOTE — Telephone Encounter (Signed)
-----   Message from Deirdre Evener, MD sent at 03/15/2023  6:43 PM EDT ----- Diagnosis 1. Skin , left scapula MELANOCYTIC NEVUS WITH HYPERPIGMENTATION WITH FOCAL SCAR, SEE DESCRIPTION 2. Skin , left proximal post medial thigh at inf buttock crease [place], INFLAMED  1-benign mole  No further treatment needed 2-benign inflamed skin tag No further treatment needed

## 2023-03-16 NOTE — Telephone Encounter (Signed)
Advised patient of results/hd  

## 2023-03-27 ENCOUNTER — Other Ambulatory Visit: Payer: Self-pay

## 2023-03-27 DIAGNOSIS — I10 Essential (primary) hypertension: Secondary | ICD-10-CM | POA: Diagnosis not present

## 2023-03-27 DIAGNOSIS — G894 Chronic pain syndrome: Secondary | ICD-10-CM | POA: Diagnosis not present

## 2023-03-27 DIAGNOSIS — Z79899 Other long term (current) drug therapy: Secondary | ICD-10-CM | POA: Diagnosis not present

## 2023-03-27 DIAGNOSIS — G43109 Migraine with aura, not intractable, without status migrainosus: Secondary | ICD-10-CM | POA: Diagnosis not present

## 2023-03-27 DIAGNOSIS — E349 Endocrine disorder, unspecified: Secondary | ICD-10-CM | POA: Diagnosis not present

## 2023-03-27 DIAGNOSIS — Z9884 Bariatric surgery status: Secondary | ICD-10-CM | POA: Diagnosis not present

## 2023-03-27 DIAGNOSIS — J4520 Mild intermittent asthma, uncomplicated: Secondary | ICD-10-CM | POA: Diagnosis not present

## 2023-03-27 DIAGNOSIS — E282 Polycystic ovarian syndrome: Secondary | ICD-10-CM | POA: Diagnosis not present

## 2023-03-27 MED ORDER — OXYCODONE-ACETAMINOPHEN 10-325 MG PO TABS
1.0000 | ORAL_TABLET | ORAL | 0 refills | Status: DC | PRN
  Filled 2023-04-03: qty 150, 25d supply, fill #0

## 2023-03-27 MED ORDER — AMPHETAMINE-DEXTROAMPHETAMINE 10 MG PO TABS
10.0000 mg | ORAL_TABLET | Freq: Two times a day (BID) | ORAL | 0 refills | Status: DC
  Filled 2023-03-27: qty 60, 30d supply, fill #0

## 2023-03-27 MED ORDER — ALPRAZOLAM 0.5 MG PO TABS
0.5000 mg | ORAL_TABLET | Freq: Three times a day (TID) | ORAL | 1 refills | Status: DC | PRN
  Filled 2023-04-21 – 2023-04-23 (×2): qty 270, 90d supply, fill #0

## 2023-03-29 ENCOUNTER — Encounter: Payer: Self-pay | Admitting: Dermatology

## 2023-03-30 ENCOUNTER — Other Ambulatory Visit: Payer: Self-pay

## 2023-03-30 MED ORDER — CEPHALEXIN 500 MG PO CAPS
500.0000 mg | ORAL_CAPSULE | Freq: Two times a day (BID) | ORAL | 0 refills | Status: DC
  Filled 2023-03-30: qty 20, 10d supply, fill #0

## 2023-04-01 ENCOUNTER — Other Ambulatory Visit: Payer: Self-pay

## 2023-04-02 ENCOUNTER — Other Ambulatory Visit: Payer: Self-pay

## 2023-04-02 MED ORDER — TIZANIDINE HCL 4 MG PO TABS
4.0000 mg | ORAL_TABLET | Freq: Three times a day (TID) | ORAL | 3 refills | Status: AC | PRN
  Filled 2023-04-02: qty 180, 60d supply, fill #0

## 2023-04-03 ENCOUNTER — Other Ambulatory Visit: Payer: Self-pay

## 2023-04-04 ENCOUNTER — Other Ambulatory Visit: Payer: Self-pay

## 2023-04-05 ENCOUNTER — Other Ambulatory Visit: Payer: Self-pay

## 2023-04-05 MED ORDER — TESTOSTERONE CYPIONATE 200 MG/ML IM SOLN
26.0000 mg | INTRAMUSCULAR | 0 refills | Status: DC
  Filled 2023-04-08: qty 1, 28d supply, fill #0
  Filled 2023-05-19: qty 1, 28d supply, fill #1

## 2023-04-06 ENCOUNTER — Other Ambulatory Visit: Payer: Self-pay

## 2023-04-08 ENCOUNTER — Other Ambulatory Visit: Payer: Self-pay

## 2023-04-08 MED ORDER — ONDANSETRON 4 MG PO TBDP
4.0000 mg | ORAL_TABLET | Freq: Three times a day (TID) | ORAL | 5 refills | Status: AC | PRN
  Filled 2023-04-08: qty 30, 10d supply, fill #0
  Filled 2023-04-20: qty 30, 10d supply, fill #1
  Filled 2023-05-18: qty 30, 10d supply, fill #2

## 2023-04-08 MED ORDER — TRAZODONE HCL 100 MG PO TABS
150.0000 mg | ORAL_TABLET | Freq: Every day | ORAL | 1 refills | Status: DC
  Filled 2023-04-08: qty 135, 90d supply, fill #0

## 2023-04-10 ENCOUNTER — Other Ambulatory Visit: Payer: Self-pay

## 2023-04-10 DIAGNOSIS — E349 Endocrine disorder, unspecified: Secondary | ICD-10-CM | POA: Diagnosis not present

## 2023-04-10 DIAGNOSIS — Z9884 Bariatric surgery status: Secondary | ICD-10-CM | POA: Diagnosis not present

## 2023-04-10 DIAGNOSIS — Z79899 Other long term (current) drug therapy: Secondary | ICD-10-CM | POA: Diagnosis not present

## 2023-04-10 MED ORDER — FLUCONAZOLE 150 MG PO TABS
ORAL_TABLET | ORAL | 1 refills | Status: DC
  Filled 2023-04-10: qty 1, 1d supply, fill #0
  Filled 2023-04-15: qty 1, 1d supply, fill #1

## 2023-04-20 ENCOUNTER — Other Ambulatory Visit: Payer: Self-pay

## 2023-04-20 MED ORDER — AMPHETAMINE-DEXTROAMPHETAMINE 20 MG PO TABS
20.0000 mg | ORAL_TABLET | Freq: Two times a day (BID) | ORAL | 0 refills | Status: DC
  Filled 2023-04-20: qty 60, 30d supply, fill #0

## 2023-04-21 ENCOUNTER — Other Ambulatory Visit: Payer: Self-pay

## 2023-04-23 ENCOUNTER — Other Ambulatory Visit: Payer: Self-pay

## 2023-04-24 ENCOUNTER — Other Ambulatory Visit: Payer: Self-pay

## 2023-04-24 MED ORDER — PREDNISONE 10 MG PO TABS
ORAL_TABLET | ORAL | 0 refills | Status: AC
  Filled 2023-04-24: qty 21, 6d supply, fill #0

## 2023-04-24 MED ORDER — PSEUDOEPHEDRINE-GUAIFENESIN ER 60-600 MG PO TB12
1.0000 | ORAL_TABLET | Freq: Two times a day (BID) | ORAL | 0 refills | Status: DC
  Filled 2023-04-24: qty 10, 5d supply, fill #0

## 2023-04-24 MED ORDER — BENZONATATE 200 MG PO CAPS
200.0000 mg | ORAL_CAPSULE | Freq: Three times a day (TID) | ORAL | 0 refills | Status: DC | PRN
  Filled 2023-04-24: qty 30, 10d supply, fill #0

## 2023-04-24 MED ORDER — OXYCODONE-ACETAMINOPHEN 10-325 MG PO TABS
1.0000 | ORAL_TABLET | ORAL | 0 refills | Status: DC | PRN
  Filled 2023-04-24: qty 150, 25d supply, fill #0

## 2023-04-24 MED ORDER — LEVOFLOXACIN 500 MG PO TABS
500.0000 mg | ORAL_TABLET | Freq: Every day | ORAL | 0 refills | Status: DC
  Filled 2023-04-24: qty 10, 10d supply, fill #0

## 2023-04-30 ENCOUNTER — Other Ambulatory Visit (HOSPITAL_COMMUNITY): Payer: Self-pay

## 2023-04-30 MED ORDER — FLUCONAZOLE 150 MG PO TABS
ORAL_TABLET | ORAL | 1 refills | Status: DC
  Filled 2023-04-30: qty 1, 1d supply, fill #0
  Filled 2023-05-18: qty 1, 1d supply, fill #1

## 2023-04-30 MED ORDER — CEPHALEXIN 500 MG PO CAPS
ORAL_CAPSULE | ORAL | 0 refills | Status: DC
  Filled 2023-04-30: qty 20, 10d supply, fill #0

## 2023-05-13 ENCOUNTER — Other Ambulatory Visit: Payer: Self-pay

## 2023-05-13 MED ORDER — OXYCODONE-ACETAMINOPHEN 10-325 MG PO TABS
1.0000 | ORAL_TABLET | ORAL | 0 refills | Status: DC | PRN
  Filled ????-??-??: fill #0

## 2023-05-14 ENCOUNTER — Other Ambulatory Visit: Payer: Self-pay

## 2023-05-14 MED ORDER — OXYCODONE-ACETAMINOPHEN 10-325 MG PO TABS
1.0000 | ORAL_TABLET | ORAL | 0 refills | Status: DC | PRN
  Filled 2023-05-14: qty 30, 5d supply, fill #0

## 2023-05-15 ENCOUNTER — Other Ambulatory Visit: Payer: Self-pay

## 2023-05-15 MED ORDER — OXYCODONE-ACETAMINOPHEN 10-325 MG PO TABS
1.0000 | ORAL_TABLET | ORAL | 0 refills | Status: DC | PRN
  Filled 2023-05-15: qty 30, 5d supply, fill #0

## 2023-05-15 MED ORDER — PREDNISONE 10 MG PO TABS
ORAL_TABLET | ORAL | 0 refills | Status: AC
  Filled 2023-05-15: qty 21, 6d supply, fill #0

## 2023-05-15 MED ORDER — AZITHROMYCIN 500 MG PO TABS
500.0000 mg | ORAL_TABLET | Freq: Every day | ORAL | 0 refills | Status: DC
  Filled 2023-05-15: qty 5, 5d supply, fill #0

## 2023-05-18 ENCOUNTER — Other Ambulatory Visit: Payer: Self-pay

## 2023-05-19 ENCOUNTER — Other Ambulatory Visit: Payer: Self-pay

## 2023-05-19 DIAGNOSIS — G894 Chronic pain syndrome: Secondary | ICD-10-CM | POA: Diagnosis not present

## 2023-05-19 DIAGNOSIS — M5412 Radiculopathy, cervical region: Secondary | ICD-10-CM | POA: Diagnosis not present

## 2023-05-19 DIAGNOSIS — R490 Dysphonia: Secondary | ICD-10-CM | POA: Diagnosis not present

## 2023-05-19 DIAGNOSIS — F4323 Adjustment disorder with mixed anxiety and depressed mood: Secondary | ICD-10-CM | POA: Diagnosis not present

## 2023-05-19 DIAGNOSIS — J4521 Mild intermittent asthma with (acute) exacerbation: Secondary | ICD-10-CM | POA: Diagnosis not present

## 2023-05-19 MED ORDER — ALPRAZOLAM 1 MG PO TABS
1.0000 mg | ORAL_TABLET | Freq: Four times a day (QID) | ORAL | 3 refills | Status: DC | PRN
  Filled 2023-05-19 (×2): qty 120, 30d supply, fill #0

## 2023-05-19 MED ORDER — AMPHETAMINE-DEXTROAMPHETAMINE 20 MG PO TABS
20.0000 mg | ORAL_TABLET | Freq: Two times a day (BID) | ORAL | 0 refills | Status: DC
  Filled 2023-05-19: qty 60, 30d supply, fill #0

## 2023-05-19 MED ORDER — SUMATRIPTAN SUCCINATE 100 MG PO TABS
100.0000 mg | ORAL_TABLET | Freq: Once | ORAL | 5 refills | Status: AC | PRN
  Filled 2023-05-19: qty 9, 30d supply, fill #0

## 2023-05-19 MED ORDER — NURTEC 75 MG PO TBDP
75.0000 mg | ORAL_TABLET | Freq: Once | ORAL | 6 refills | Status: DC | PRN
  Filled 2023-05-19: qty 16, 32d supply, fill #0

## 2023-05-19 MED ORDER — ALPRAZOLAM 1 MG PO TABS: 1.0000 mg | ORAL_TABLET | Freq: Four times a day (QID) | ORAL | 3 refills | Status: DC | PRN

## 2023-05-19 MED ORDER — OXYCODONE HCL 20 MG PO TABS
20.0000 mg | ORAL_TABLET | ORAL | 0 refills | Status: DC | PRN
  Filled 2023-05-19 (×2): qty 150, 25d supply, fill #0

## 2023-05-19 MED ORDER — ALBUTEROL SULFATE HFA 108 (90 BASE) MCG/ACT IN AERS
2.0000 | INHALATION_SPRAY | RESPIRATORY_TRACT | 3 refills | Status: AC | PRN
  Filled 2023-05-19: qty 6.7, 16d supply, fill #0

## 2023-05-20 ENCOUNTER — Other Ambulatory Visit: Payer: Self-pay

## 2023-05-20 ENCOUNTER — Other Ambulatory Visit (HOSPITAL_COMMUNITY): Payer: Self-pay

## 2023-05-20 ENCOUNTER — Other Ambulatory Visit (HOSPITAL_BASED_OUTPATIENT_CLINIC_OR_DEPARTMENT_OTHER): Payer: Self-pay

## 2023-06-01 ENCOUNTER — Encounter (HOSPITAL_COMMUNITY): Payer: Self-pay | Admitting: *Deleted

## 2023-06-01 ENCOUNTER — Other Ambulatory Visit: Payer: Self-pay

## 2023-06-17 ENCOUNTER — Other Ambulatory Visit: Payer: Self-pay

## 2023-08-25 ENCOUNTER — Encounter: Payer: Self-pay | Admitting: Dermatology

## 2023-09-17 ENCOUNTER — Ambulatory Visit: Payer: Commercial Managed Care - PPO | Admitting: Dermatology

## 2023-10-08 ENCOUNTER — Ambulatory Visit
Admission: RE | Admit: 2023-10-08 | Discharge: 2023-10-08 | Disposition: A | Discharge status: Home / Self Care | Payer: BC Managed Care – PPO | Source: Ambulatory Visit | Attending: Family Medicine | Admitting: Family Medicine

## 2023-10-08 ENCOUNTER — Other Ambulatory Visit: Payer: Self-pay | Admitting: Family Medicine

## 2023-10-08 DIAGNOSIS — M7989 Other specified soft tissue disorders: Secondary | ICD-10-CM | POA: Insufficient documentation

## 2023-10-26 ENCOUNTER — Ambulatory Visit: Payer: BC Managed Care – PPO | Admitting: Dermatology

## 2023-10-27 ENCOUNTER — Ambulatory Visit: Payer: BC Managed Care – PPO | Admitting: Dermatology

## 2023-10-27 ENCOUNTER — Encounter: Payer: Self-pay | Admitting: Dermatology

## 2023-10-27 DIAGNOSIS — D492 Neoplasm of unspecified behavior of bone, soft tissue, and skin: Secondary | ICD-10-CM

## 2023-10-27 DIAGNOSIS — L905 Scar conditions and fibrosis of skin: Secondary | ICD-10-CM | POA: Diagnosis not present

## 2023-10-27 DIAGNOSIS — D485 Neoplasm of uncertain behavior of skin: Secondary | ICD-10-CM

## 2023-10-27 DIAGNOSIS — L570 Actinic keratosis: Secondary | ICD-10-CM | POA: Diagnosis not present

## 2023-10-27 NOTE — Patient Instructions (Signed)

## 2023-10-27 NOTE — Progress Notes (Signed)
   Follow-Up Visit   Subjective  Victoria Silva is a 42 y.o. female who presents for the following: scaly spot at right ear, present since June. It can be very sore, especially if she is laying on it.   The patient has spots, moles and lesions to be evaluated, some may be new or changing and the patient may have concern these could be cancer.   The following portions of the chart were reviewed this encounter and updated as appropriate: medications, allergies, medical history  Review of Systems:  No other skin or systemic complaints except as noted in HPI or Assessment and Plan.  Objective  Well appearing patient in no apparent distress; mood and affect are within normal limits.   A focused examination was performed of the following areas: Face, ears  Relevant exam findings are noted in the Assessment and Plan.  right antihelix 5 mm pink scaly eroded papule       Assessment & Plan     Neoplasm of uncertain behavior of skin right antihelix  Skin / nail biopsy Type of biopsy: tangential   Informed consent: discussed and consent obtained   Timeout: patient name, date of birth, surgical site, and procedure verified   Procedure prep:  Patient was prepped and draped in usual sterile fashion Prep type:  Isopropyl alcohol Anesthesia: the lesion was anesthetized in a standard fashion   Anesthetic:  1% lidocaine w/ epinephrine 1-100,000 buffered w/ 8.4% NaHCO3 Instrument used: DermaBlade   Hemostasis achieved with: pressure and aluminum chloride   Outcome: patient tolerated procedure well   Post-procedure details: sterile dressing applied and wound care instructions given   Dressing type: bandage and petrolatum    Specimen 1 - Surgical pathology Differential Diagnosis: chondrodermatitis nodularis helicis. 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 Biopsy as treatment and to rule out SCC Avoid pressure on spot by not sleeping on that  side Return if recurrent or not healing Stop neosporin and start vaseline daily    Return if symptoms worsen or fail to improve.  Anise Salvo, RMA, am acting as scribe for Elie Goody, MD .   Documentation: I have reviewed the above documentation for accuracy and completeness, and I agree with the above.  Elie Goody, MD

## 2023-10-30 LAB — SURGICAL PATHOLOGY

## 2023-11-02 ENCOUNTER — Telehealth: Payer: Self-pay

## 2023-11-02 ENCOUNTER — Ambulatory Visit: Payer: BC Managed Care – PPO | Admitting: Dermatology

## 2023-11-02 NOTE — Telephone Encounter (Signed)
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

## 2023-11-02 NOTE — Telephone Encounter (Signed)
Called patient. Discussed pathology results. Patient voiced understanding. She will call back to schedule appointment for recheck.

## 2024-03-17 ENCOUNTER — Telehealth

## 2024-03-18 ENCOUNTER — Ambulatory Visit: Payer: Self-pay

## 2024-03-18 ENCOUNTER — Telehealth: Payer: Self-pay

## 2024-10-20 ENCOUNTER — Ambulatory Visit
Admission: EM | Admit: 2024-10-20 | Discharge: 2024-10-20 | Disposition: A | Discharge status: Home / Self Care | Payer: Self-pay

## 2024-10-20 ENCOUNTER — Emergency Department: Payer: Self-pay

## 2024-10-20 ENCOUNTER — Emergency Department
Admission: EM | Admit: 2024-10-20 | Discharge: 2024-10-20 | Disposition: A | Discharge status: Home / Self Care | Payer: Self-pay | Attending: Emergency Medicine | Admitting: Emergency Medicine

## 2024-10-20 DIAGNOSIS — R635 Abnormal weight gain: Secondary | ICD-10-CM

## 2024-10-20 DIAGNOSIS — R600 Localized edema: Secondary | ICD-10-CM | POA: Insufficient documentation

## 2024-10-20 DIAGNOSIS — N838 Other noninflammatory disorders of ovary, fallopian tube and broad ligament: Secondary | ICD-10-CM | POA: Insufficient documentation

## 2024-10-20 DIAGNOSIS — R609 Edema, unspecified: Secondary | ICD-10-CM

## 2024-10-20 DIAGNOSIS — R0989 Other specified symptoms and signs involving the circulatory and respiratory systems: Secondary | ICD-10-CM

## 2024-10-20 LAB — COMPREHENSIVE METABOLIC PANEL WITH GFR
ALT: 21 U/L (ref 0–44)
AST: 24 U/L (ref 15–41)
Albumin: 3.8 g/dL (ref 3.5–5.0)
Alkaline Phosphatase: 61 U/L (ref 38–126)
Anion gap: 11 (ref 5–15)
BUN: 10 mg/dL (ref 6–20)
CO2: 24 mmol/L (ref 22–32)
Calcium: 8.8 mg/dL — ABNORMAL LOW (ref 8.9–10.3)
Chloride: 104 mmol/L (ref 98–111)
Creatinine, Ser: 0.51 mg/dL (ref 0.44–1.00)
GFR, Estimated: >60 mL/min (ref >60)
Glucose, Bld: 88 mg/dL (ref 70–99)
Potassium: 3.5 mmol/L (ref 3.5–5.1)
Sodium: 138 mmol/L (ref 135–145)
Total Bilirubin: 0.5 mg/dL (ref 0.0–1.2)
Total Protein: 6.2 g/dL — ABNORMAL LOW (ref 6.5–8.1)

## 2024-10-20 LAB — URINALYSIS, ROUTINE W REFLEX MICROSCOPIC
Bilirubin Urine: NEGATIVE
Glucose, UA: NEGATIVE mg/dL
Hgb urine dipstick: NEGATIVE
Ketones, ur: 5 mg/dL — AB
Leukocytes,Ua: NEGATIVE
Nitrite: NEGATIVE
Protein, ur: NEGATIVE mg/dL
Specific Gravity, Urine: 1.012 (ref 1.005–1.030)
pH: 5 (ref 5.0–8.0)

## 2024-10-20 LAB — CBC
HCT: 33.9 % — ABNORMAL LOW (ref 36.0–46.0)
Hemoglobin: 11.1 g/dL — ABNORMAL LOW (ref 12.0–15.0)
MCH: 28.5 pg (ref 26.0–34.0)
MCHC: 32.7 g/dL (ref 30.0–36.0)
MCV: 87.1 fL (ref 80.0–100.0)
Platelets: 324 K/uL (ref 150–400)
RBC: 3.89 MIL/uL (ref 3.87–5.11)
RDW: 14.6 % (ref 11.5–15.5)
WBC: 11.8 K/uL — ABNORMAL HIGH (ref 4.0–10.5)
nRBC: 0 % (ref 0.0–0.2)

## 2024-10-20 LAB — PRO BRAIN NATRIURETIC PEPTIDE: Pro Brain Natriuretic Peptide: 190 pg/mL (ref <300.0)

## 2024-10-20 LAB — TROPONIN T, HIGH SENSITIVITY: Troponin T High Sensitivity: <15 ng/L (ref 0–19)

## 2024-10-20 MED ORDER — FUROSEMIDE 20 MG PO TABS: 20.0000 mg | ORAL_TABLET | Freq: Every day | ORAL | 0 refills | Status: DC

## 2024-10-20 MED ORDER — IOHEXOL 300 MG/ML  SOLN
100.0000 mL | Freq: Once | INTRAMUSCULAR | Status: AC | PRN
  Administered 2024-10-20: 100 mL via INTRAVENOUS

## 2024-10-20 NOTE — ED Triage Notes (Signed)
 Provider triage

## 2024-10-20 NOTE — Discharge Instructions (Signed)
 Go to the emergency department for evaluation of the swelling in both of your legs, weight gain over the last week, and abnormal breath sounds today.

## 2024-10-20 NOTE — ED Provider Notes (Signed)
 [facility] Provider Note    Event Date/Time   First MD Initiated Contact with Patient 10/20/24 1927     (approximate)   History   Leg Swelling   HPI  Victoria Silva is a 43 y.o. female to history of prior gastric bypass, fatty liver disease, with some elements of chronic pain and anxiety.  She works as a doctor, general practice.  She noticed few days ago that seem like she had swelling of both of her legs moderately.  She thought may have been from standing at work or sitting at work, but then noticed today that it feels like she is a little swollen in her hands and upper arms as well.  She stepped on the scale and reports she gained about 15 to 17 pounds in the last week.  She is not pregnant she has had a previous partial hysterectomy.  She has no chest pain or shortness of breath.  She has never had swelling like this before she has no new medication changes.  She does recall that occasionally she will get crampy pain in the abdomen after bariatric surgery, but last week had a particularly severe bout of what felt like crampy severe pain in the left mid to upper abdomen but it seems to have improved.  She always has a little bit of soreness in the abdomen from previous C-sections and gastric bypass  No fevers no rash.  She also repeats that she feels like her stools have been a little bit lighter in color recently  She has not had any fevers no pain or burning with urination   Denies any history of kidney disease  Physical Exam   Triage Vital Signs: ED Triage Vitals  Encounter Vitals Group     BP 10/20/24 1836 (!) 140/100     Girls Systolic BP Percentile --      Girls Diastolic BP Percentile --      Boys Systolic BP Percentile --      Boys Diastolic BP Percentile --      Pulse Rate 10/20/24 1836 80     Resp 10/20/24 1836 18     Temp 10/20/24 1836 99.4 F (37.4 C)     Temp Source 10/20/24 1836 Oral     SpO2 10/20/24 1836 100 %     Weight  10/20/24 1837 160 lb (72.6 kg)     Height 10/20/24 1837 5' 1.5 (1.562 m)     Head Circumference --      Peak Flow --      Pain Score 10/20/24 1837 0     Pain Loc --      Pain Education --      Exclude from Growth Chart --     Most recent vital signs: Vitals:   10/20/24 1836  BP: (!) 140/100  Pulse: 80  Resp: 18  Temp: 99.4 F (37.4 C)  SpO2: 100%     General: Awake, no distress.  Very pleasant husband at bedside CV:  Good peripheral perfusion.  Normal tones and rate Resp:  Normal effort.  Clear bilateral.  No JVD Abd:  No distention.  Soft nontender nondistended EXTR reports some discomfort to palpation of left upper abdomen but reports it is hard to tell if she ice has some element of chronic discomfort.  No evidence of any hernias or abnormal scars Other:  In her legs she has very mild but present bilateral about 1+ pitting edema.  She does not  have pitting edema in her hands or forearms but does report they feel little swollen, and normally she reports she can see her veins in her left arm pretty easily.  Maybe some very slight edema in the arms but it is hard for me to discern there is certainly no pitting edema.  She does not appear to have anasarca or abdominal wall or whole-body edema Face appears normal without obvious edema though she reports it just looks maybe a little swollen to her   ED Results / Procedures / Treatments   Labs (all labs ordered are listed, but only abnormal results are displayed) Labs Reviewed  CBC - Abnormal; Notable for the following components:      Result Value   WBC 11.8 (*)    Hemoglobin 11.1 (*)    HCT 33.9 (*)    All other components within normal limits  COMPREHENSIVE METABOLIC PANEL WITH GFR - Abnormal; Notable for the following components:   Calcium 8.8 (*)    Total Protein 6.2 (*)    All other components within normal limits  URINALYSIS, ROUTINE W REFLEX MICROSCOPIC - Abnormal; Notable for the following components:   Color,  Urine YELLOW (*)    APPearance CLEAR (*)    Ketones, ur 5 (*)    Bacteria, UA RARE (*)    All other components within normal limits  PRO BRAIN NATRIURETIC PEPTIDE  TROPONIN T, HIGH SENSITIVITY     EKG  ED ECG REPORT I, Oneil Budge, the attending physician, personally viewed and interpreted this ECG.  Date: 10/20/2024 EKG Time: 1845 Rate: 75 Rhythm: normal sinus rhythm QRS Axis: normal Intervals: normal ST/T Wave abnormalities: normal Narrative Interpretation: no evidence of acute ischemia Voltages appear normal arguing against pericardial effusion   RADIOLOGY  The chest x-ray is clear  Given no great explanation for edema and the association of having a relatively severe episode of abdominal pain earlier in the week and her history of gastric bypass we will proceed with CT imaging to evaluate for acute intra-abdominal abnormality, I feel highly unlikely the patient would have an acute infarction or ischemic disease but this would also be helpful in assessing for other areas of edema, renal and ureteral anatomy etc.  Ultrasound to exclude DVTs that seems to be bilateral in nature, and also assess for any evidence of liver disease or cirrhosis   CT ABDOMEN PELVIS W CONTRAST Result Date: 10/20/2024 EXAM: CT ABDOMEN AND PELVIS WITH CONTRAST 10/20/2024 09:41:26 PM TECHNIQUE: CT of the abdomen and pelvis was performed with the administration of 100 mL of iohexol  (OMNIPAQUE ) 300 MG/ML solution. Multiplanar reformatted images are provided for review. Automated exposure control, iterative reconstruction, and/or weight-based adjustment of the mA/kV was utilized to reduce the radiation dose to as low as reasonably achievable. COMPARISON: CT abdomen and pelvis 12:29 CLINICAL HISTORY: LUQ abdominal pain; abd pain off on LUQ for 1 week, hsitory gastric bypass. FINDINGS: LOWER CHEST: No acute abnormality. LIVER: Enlarged liver measuring up to 23 cm, likely due to a Reidel lobe. GALLBLADDER AND  BILE DUCTS: Status post cholecystectomy. No biliary ductal dilatation. SPLEEN: No acute abnormality. PANCREAS: No acute abnormality. ADRENAL GLANDS: No acute abnormality. KIDNEYS, URETERS AND BLADDER: Elongated 4 mm left nephrolithiasis. No right nephrolithiasis. No ureterolithiasis bilaterally. No hydronephrosis. No perinephric or periureteral stranding. Urinary bladder is unremarkable. GI AND BOWEL: Roux-en-Y gastric bypass surgical changes. No small or large bowel wall thickening or dilatation. The appendix is not definitively identified and may be located within the right  adnexa. PERITONEUM AND RETROPERITONEUM: Trace free fluid within the pouch of Douglas. No free air. VASCULATURE: Aorta is normal in caliber. LYMPH NODES: No lymphadenopathy. REPRODUCTIVE ORGANS: Status post hysterectomy with fluid density lesion at the apex of the remaining cervical tissue. Question irregular cystic and solid lesion in the region of the right adnexa/ovary adjacent to a corpus luteum cyst. May represent ovarian tissue with dominant follicles and venous structures. Consider pelvic ultrasound for further evaluation. Unremarkable left adnexa. BONES AND SOFT TISSUES: No acute osseous abnormality. No focal soft tissue abnormality. IMPRESSION: 1. Question irregular cystic and solid lesion in the right adnexal region adjacent to a corpus luteum cyst; recommend pelvic ultrasound for further evaluation. Differential diagnosis includes normal ovarian tissue with adjacent ovarian venous vasculature and likely and adjacent normal appendix. 2. Nonobstructive left 4 mm nephrolithiasis. Electronically signed by: Morgane Naveau MD 10/20/2024 09:59 PM EST RP Workstation: [id number]   US  Venous Img Lower Bilateral Result Date: 10/20/2024 CLINICAL DATA:  Bilateral lower extremity edema. EXAM: Bilateral LOWER EXTREMITY VENOUS DOPPLER ULTRASOUND TECHNIQUE: Gray-scale sonography with compression, as well as color and duplex ultrasound, were  performed to evaluate the deep venous system(s) from the level of the common femoral vein through the popliteal and proximal calf veins. COMPARISON:  None Available. FINDINGS: VENOUS Normal compressibility of the common femoral, superficial femoral, and popliteal veins, as well as the visualized calf veins. Visualized portions of profunda femoral vein and great saphenous vein unremarkable. No filling defects to suggest DVT on grayscale or color Doppler imaging. Doppler waveforms show normal direction of venous flow, normal respiratory plasticity and response to augmentation. Limited views of the contralateral common femoral vein are unremarkable. OTHER None. Limitations: none IMPRESSION: Negative. Electronically Signed   By: Vanetta Chou M.D.   On: 10/20/2024 21:22   US  Abdomen Complete Result Date: 10/20/2024 EXAM: COMPLETE ABDOMINAL ULTRASOUND TECHNIQUE: Real-time ultrasonography of the abdomen was performed. COMPARISON: None available. CLINICAL HISTORY: edema, reports stools light in color, weight gain. eval for liver, ascitis, or renal disease FINDINGS: LIVER: The liver demonstrates normal echogenicity. No intrahepatic biliary ductal dilatation. No mass. BILIARY SYSTEM: Status post cholecystectomy. Negative sonographic murphy sign. The common bile duct diameter measures 2.5 cm. KIDNEYS: The right kidney measures 12.8 cm. The left kidney measures 12.5 cm. Normal contour of kidneys. Normal cortical echogenicity. No hydronephrosis. No calculus. No mass. PANCREAS: The pancreas is not visualized due to overlying bowel. SPLEEN: The spleen measures 12.7 cm. No acute abnormality. VESSELS: The abdominal aorta measures up to 2.4 cm. The distal abdominal aorta is not well visualized due to overlying bowel. Visualized portion of the inferior vena cava is normal. OTHER: No ascites. IMPRESSION: 1. No acute sonographic abnormality. 2. Status post cholecystectomy. Electronically signed by: Morgane Naveau MD 10/20/2024  09:21 PM EST RP Workstation: [id number]   DG Chest 2 View Result Date: 10/20/2024 CLINICAL DATA:  Shortness of breath EXAM: CHEST - 2 VIEW COMPARISON:  Chest x-ray 11/29/2022 FINDINGS: The heart size and mediastinal contours are within normal limits. Both lungs are clear. The visualized skeletal structures are unremarkable. IMPRESSION: No active cardiopulmonary disease. Electronically Signed   By: Greig Pique M.D.   On: 10/20/2024 19:29     PROCEDURES:  Critical Care performed: No  Procedures   MEDICATIONS ORDERED IN ED: Medications  iohexol  (OMNIPAQUE ) 300 MG/ML solution 100 mL (100 mLs Intravenous Contrast Given 10/20/24 2124)     IMPRESSION / MDM / ASSESSMENT AND PLAN / ED COURSE  I reviewed the triage vital signs and  the nursing notes.                              Differential diagnosis includes, but is not limited to, possible hypovolemia, vasodilation, medication effect though her medications have been steadily taken and she does not take any ACE inhibitor, ARB, antihypertensive or calcium channel blocker.  She has no chest pain no shortness of breath.  Her chest x-ray is clear without evidence of edema her lung sounds are clear.  She is well-appearing without distress and her vital signs are reassuring except for mild to moderate hypertension.  Very slightly decreased protein.  Will check urinalysis to evaluate for proteinuria or findings that could suggest a more nephrotic type cause   Not pregnant previous hysterectomy.  Overall reassuring exam but does have some at least mild edema and reports weight gain.  If her workup here is reassuring I think follow-up with her primary in [place] which she reports she could follow-up with would be appropriate we may wish to trial short dose of furosemide .  Patient's presentation is most consistent with acute complicated illness / injury requiring diagnostic workup.      Clinical Course as of 10/20/24 2247  Thu Oct 20, 2024  2010  Current Rx include trazodone , gabapentin , zanaflex , multivitamin, xanax  and percocet prn [MQ]  2010 Occasionl imitrex . Zofran  prn.  [MQ]  2013 About 15lb weigh gain over 1 week [MQ]  2121 Urinalysis without proteinuria, highly reassuring against acute nephrotic type cause [MQ]  2121 BNP normal arguing against increased atrial pressure or CHF.  Troponin normal.  I think a single troponin is always needed she has no chest pain no shortness of breath.  She has no tachycardia no hypoxia no pleuritic pain no findings that would [MQ]  2121 ACS pneumonia or other cause no findings to suggest suggest concern for thromboembolism or cause that would be acute cardiopulmonary at this juncture [MQ]    Clinical Course User Index [MQ] Dicky Anes, MD   ----------------------------------------- 10:44 PM on 10/20/2024 ----------------------------------------- Called patient she eloped.  She was seen walking out of the ER about 20 minutes ago.  I was not aware, but I was able to call her and she answered the phone.  Reviewed her test results at this point.  She reports she left after she used a call bell twice and did not receive service.  She walked out to the nurses desk reports she did not see nurses, and made the decision that she was going to leave.  She and her husband are currently picking up takeout.  She would like to try Lasix  for a few days and see if this improves her edema, and I think that is reasonable.  Discussed careful return precautions with her though she did not stay for official discharge and did elope.  She return to the ER if she develops increased swelling shortness of breath chest pain or other concerns arise.  In the meantime she will follow-up closely with her medical care team in [place] as well.  She is quite familiar with Lasix  and her nurse and had no further questions.  Additionally discussed with the patient that she has a complex appearing cystic like lesion in her right pelvis.   Patient reports history of polycystic ovaries, and she reports that she will follow-up with her doctor in [place] on this as well.  She understands the importance of follow-up for all issues including swelling, cyst,  and carefully discussed return precautions via the phone.  She did not stay to receive discharge documentation and eloped  FINAL CLINICAL IMPRESSION(S) / ED DIAGNOSES   Final diagnoses:  Peripheral edema  Complex cyst of uterine adnexa     Rx / DC Orders   ED Discharge Orders          Ordered    furosemide (LASIX) 20 MG tablet  Daily        10/20/24 2243             Note:  This document was prepared using Dragon voice recognition software and may include unintentional dictation errors.   Dicky Anes, MD 10/20/24 [phone]

## 2024-10-20 NOTE — ED Notes (Addendum)
 Pt was seen leaving the ED by charge RN who notified this RN. IV catheter found taken out and on the bed. ED Provider made aware.

## 2024-10-20 NOTE — ED Notes (Signed)
 US  at bedside.

## 2024-10-20 NOTE — ED Provider Notes (Signed)
 [facility]    CSN: [id number] Arrival date & time: 10/20/24  1550      History   Chief Complaint Chief Complaint  Patient presents with   Leg Swelling    HPI Victoria Silva is a 43 y.o. female.  Accompanied by her husband, patient presents with 4-day history of bilateral leg swelling and weight gain 17 pounds over the last week.  The swelling has now started in both hands and arms.  She denies chest pain or shortness of breath.  No history of heart failure.  Patient also mentions she has had some discomfort with urination and flank pain.  She is concerned for kidney injury.  Her medical history includes hypertension, asthma, degenerative disc disease, migraines, GERD.  The history is provided by the patient and medical records.    Past Medical History:  Diagnosis Date   Arthritis    Asthma    Bulging of cervical intervertebral disc    Degenerative disc disease, cervical    Fatty liver    GERD (gastroesophageal reflux disease)    Hypertension    Migraine    1-2 MONTHLY    Rash 10/14/2018   patient reports the following: onset 10-14-18, noticed painful burning, itching sensation to mid back; in mirror appeared red, cluster of bumps; did Evisit and was rx'd antiviral d/t suspicion of shingles ; no imprevmetn with antiviral , used clamnine lotion with releif of burning , pain and most of the redness; reports she has previously used heating pads to the area where the rash formed;   Sleep apnea    NO DEVICE IN  USE     Patient Active Problem List   Diagnosis Date Noted   GERD (gastroesophageal reflux disease) 10/26/2018   Prediabetes 10/26/2018   Biliary dyskinesia 10/26/2018   Severe obesity (HCC) 10/26/2018   Essential hypertension 02/05/2017   Morbid obesity (HCC) 02/05/2017   Elevated liver enzymes 02/05/2017   Chronic abdominal pain 02/05/2017   Mild intermittent asthma without complication 02/05/2017   Polycystic ovarian syndrome 02/05/2017    Fatty liver 02/05/2017    Past Surgical History:  Procedure Laterality Date   ABDOMINAL HYSTERECTOMY  2008   uterus removed only   CESAREAN SECTION     X3   CHOLECYSTECTOMY N/A 10/26/2018   Procedure: LAPAROSCOPIC CHOLECYSTECTOMY;  Surgeon: Tanda Locus, MD;  Location: [facility] ORS;  Service: General;  Laterality: N/A;   ESOPHAGOGASTRODUODENOSCOPY (EGD) WITH PROPOFOL  N/A 06/05/2017   Procedure: ESOPHAGOGASTRODUODENOSCOPY (EGD) WITH PROPOFOL ;  Surgeon: Viktoria Lamar DASEN, MD;  Location: [facility] ENDOSCOPY;  Service: Endoscopy;  Laterality: N/A;   GASTRIC ROUX-EN-Y N/A 10/26/2018   Procedure: LAPAROSCOPIC ROUX-EN-Y GASTRIC BYPASS WITH UPPER ENDOSCOPY WITH ERAS PATHWAY;  Surgeon: Tanda Locus, MD;  Location: [facility] ORS;  Service: General;  Laterality: N/A;    OB History   No obstetric history on file.      Home Medications    Prior to Admission medications   Medication Sig Start Date End Date Taking? Authorizing Provider  albuterol  (VENTOLIN  HFA) 108 (90 Base) MCG/ACT inhaler Inhale 2 inhalations into the lungs every 4 (four) hours as needed for Wheezing 05/13/22     albuterol  (VENTOLIN  HFA) 108 (90 Base) MCG/ACT inhaler Inhale 2 puffs into the lungs every 4 (four) hours as needed for wheezing 05/19/23     amphetamine -dextroamphetamine  (ADDERALL) 20 MG tablet Take 1 tablet (20 mg total) by mouth 2 (two) times daily. 05/19/23     cephALEXin  (KEFLEX ) 500 MG capsule Take 1 capsule (  500 mg total) by mouth 2 (two) times daily for 10 days 04/30/23     fluconazole  (DIFLUCAN ) 150 MG tablet Take 1 tablet (150 mg total) by mouth once for 1 dose. May repeat once if symptoms recur.  Refill on prescription. 04/30/23     gabapentin  (NEURONTIN ) 300 MG capsule Take 1 capsule (300 mg total) by mouth 2 (two) times daily. 03/11/23     Galcanezumab -gnlm (EMGALITY ) 120 MG/ML SOAJ Inject 120 mg subcutaneously every 28 (twenty-eight) days. Start 1 month after loading dose 10/20/22     mometasone  (ELOCON ) 0.1 % lotion Apply  topically daily. Up to 5 times per week to affected area of scalp and wrist 03/11/23   Victoria Alm BROCKS, MD  ondansetron  (ZOFRAN -ODT) 4 MG disintegrating tablet Take 1 tablet (4 mg total) by mouth every 8 (eight) hours as needed for nausea 04/08/23     pseudoephedrine -guaifenesin  (MUCINEX  D) 60-600 MG 12 hr tablet Take 1 tablet by mouth every 12 (twelve) hours. 04/24/23     Rimegepant Sulfate  (NURTEC) 75 MG TBDP Take 1 tablet (75 mg total) by mouth as directed Maximum dose in 24 hours is 1 tablet (75 mg) 10/20/22     Rimegepant Sulfate  (NURTEC) 75 MG TBDP Take 1 tablet (75 mg total) by mouth once as directed. Maximum dose in 24 hours is 1 tablet (75 mg). 05/19/23     SUMAtriptan  (IMITREX ) 100 MG tablet Take 1 tablet (100 mg total) by mouth once as needed for up to 1 dose for Migraine. May take a second dose after 2 hours if needed. 05/18/23     testosterone  cypionate (DEPOTESTOSTERONE CYPIONATE) 200 MG/ML injection Inject 0.13 mLs (26 mg total) into the muscle every 28 (twenty-eight) days for 30 days Called to pharmacy 04/05/23     tiZANidine  (ZANAFLEX ) 4 MG tablet Take 1 tablet (4 mg total) by mouth every 8 (eight) hours as needed. 04/01/23     traZODone  (DESYREL ) 100 MG tablet Take 1.5 tablets (150 mg total) by mouth at bedtime. 04/08/23     bisoprolol -hydrochlorothiazide  (ZIAC ) 5-6.25 MG tablet Take 1 tablet by mouth daily.  01/06/17 11/26/20  [provider]  pantoprazole  (PROTONIX ) 40 MG tablet Take 1 tablet (40 mg total) by mouth daily. 10/27/18 11/26/20  Gladis Cough, MD  triamterene-hydrochlorothiazide  (DYAZIDE) 37.5-25 MG capsule Take 1 capsule by mouth daily.   11/26/20  [provider]    Family History Family History  Problem Relation Age of Onset   Diabetes Mellitus I Mother    Hypertension Maternal Grandmother    Hyperlipidemia Maternal Grandmother     Social History Social History   Tobacco Use   Smoking status: Never   Smokeless tobacco: Never   Tobacco comments:     IN TEENS   Vaping Use   Vaping status: Never Used  Substance Use Topics   Alcohol use: No   Drug use: No     Allergies   Patient has no known allergies.   Review of Systems Review of Systems  Constitutional:  Negative for chills and fever.  Respiratory:  Negative for cough and shortness of breath.   Cardiovascular:  Positive for leg swelling. Negative for chest pain and palpitations.  Gastrointestinal:  Negative for abdominal pain.  Genitourinary:  Positive for dysuria and flank pain. Negative for hematuria.  Skin:  Negative for color change, rash and wound.  Neurological:  Negative for dizziness, weakness and numbness.     Physical Exam Triage Vital Signs ED Triage Vitals  Encounter Vitals  Group     BP      Girls Systolic BP Percentile      Girls Diastolic BP Percentile      Boys Systolic BP Percentile      Boys Diastolic BP Percentile      Pulse      Resp      Temp      Temp src      SpO2      Weight      Height      Head Circumference      Peak Flow      Pain Score      Pain Loc      Pain Education      Exclude from Growth Chart    No data found.  Updated Vital Signs BP 133/89   Pulse 82   Temp 98.2 F (36.8 C)   Resp 18   SpO2 97%   Visual Acuity Right Eye Distance:   Left Eye Distance:   Bilateral Distance:    Right Eye Near:   Left Eye Near:    Bilateral Near:     Physical Exam Constitutional:      General: She is not in acute distress. HENT:     Mouth/Throat:     Mouth: Mucous membranes are moist.  Cardiovascular:     Rate and Rhythm: Normal rate and regular rhythm.     Heart sounds: Normal heart sounds.  Pulmonary:     Effort: Pulmonary effort is normal. No respiratory distress.     Comments: Faint rhonchi or rails noted in right lower lung. Musculoskeletal:     Right lower leg: Edema present.     Left lower leg: Edema present.     Comments: Trace pitting edema of both lower legs, worse on right.  Skin:    General: Skin  is warm and dry.  Neurological:     Mental Status: She is alert.      UC Treatments / Results  Labs (all labs ordered are listed, but only abnormal results are displayed) Labs Reviewed - No data to display  EKG   Radiology No results found.  Procedures Procedures (including critical care time)  Medications Ordered in UC Medications - No data to display  Initial Impression / Assessment and Plan / UC Course  I have reviewed the triage vital signs and the nursing notes.  Pertinent labs & imaging results that were available during my care of the patient were reviewed by me and considered in my medical decision making (see chart for details).    Pitting edema, weight gain, abnormal lung sounds.  Afebrile and vital signs are stable.  Patient has no history of heart failure.  She has no chest pain or shortness of breath.  She has trace pitting edema of both lower legs, although this is worse on the right side.  She has abnormal breath sounds in the right lower lung.  She reports 17 pound weight gain in the last week.  Discussed limitations of evaluation of her symptoms in an urgent care setting.  Sending her to the ED for evaluation.  She is agreeable to this.  She is accompanied by her husband and will [facility] ED now.  Final Clinical Impressions(s) / UC Diagnoses   Final diagnoses:  Pitting edema  Weight gain  Abnormal lung sounds     Discharge Instructions      Go to the emergency department for evaluation of the swelling in  both of your legs, weight gain over the last week, and abnormal breath sounds today.     ED Prescriptions   None    PDMP not reviewed this encounter.   Corlis Burnard DEL, NP 10/20/24 [phone]

## 2024-10-20 NOTE — ED Triage Notes (Signed)
 Pt presents to the ED via POV from home with husband. Pt states that she has had bilateral leg swelling since Monday. Reports some swelling in bilateral arms. Pt states that she has gained 17lbs in a week. Pt denies hx of CHF. Pt states that she has had to use her inhalers more often recently. Pt states that she was [facility] here from [facility].

## 2024-10-21 ENCOUNTER — Ambulatory Visit: Payer: Self-pay

## 2024-11-04 ENCOUNTER — Ambulatory Visit: Payer: Self-pay

## 2024-11-07 ENCOUNTER — Ambulatory Visit: Payer: Self-pay

## 2024-12-06 ENCOUNTER — Ambulatory Visit: Payer: Self-pay

## 2024-12-23 ENCOUNTER — Ambulatory Visit: Payer: Self-pay

## 2024-12-30 ENCOUNTER — Ambulatory Visit: Payer: Self-pay | Admitting: Student in an Organized Health Care Education/Training Program

## 2024-12-30 VITALS — BP 112/80 | HR 75 | Ht 61.5 in | Wt 155.0 lb

## 2024-12-30 DIAGNOSIS — G43709 Chronic migraine without aura, not intractable, without status migrainosus: Secondary | ICD-10-CM | POA: Insufficient documentation

## 2024-12-30 DIAGNOSIS — R7303 Prediabetes: Secondary | ICD-10-CM

## 2024-12-30 DIAGNOSIS — Z13220 Encounter for screening for lipoid disorders: Secondary | ICD-10-CM

## 2024-12-30 DIAGNOSIS — R41840 Attention and concentration deficit: Secondary | ICD-10-CM | POA: Diagnosis not present

## 2024-12-30 DIAGNOSIS — F39 Unspecified mood [affective] disorder: Secondary | ICD-10-CM | POA: Diagnosis not present

## 2024-12-30 DIAGNOSIS — G8929 Other chronic pain: Secondary | ICD-10-CM | POA: Diagnosis not present

## 2024-12-30 DIAGNOSIS — M542 Cervicalgia: Secondary | ICD-10-CM

## 2024-12-30 DIAGNOSIS — F988 Other specified behavioral and emotional disorders with onset usually occurring in childhood and adolescence: Secondary | ICD-10-CM | POA: Insufficient documentation

## 2024-12-30 DIAGNOSIS — Z114 Encounter for screening for human immunodeficiency virus [HIV]: Secondary | ICD-10-CM

## 2024-12-30 DIAGNOSIS — Z1159 Encounter for screening for other viral diseases: Secondary | ICD-10-CM

## 2024-12-30 LAB — LIPID PANEL
Cholesterol: 143 mg/dL (ref 28–200)
HDL: 56.8 mg/dL
LDL Cholesterol: 74 mg/dL (ref 10–99)
NonHDL: 86.46
Total CHOL/HDL Ratio: 3
Triglycerides: 64 mg/dL (ref 10.0–149.0)
VLDL: 12.8 mg/dL (ref 0.0–40.0)

## 2024-12-30 LAB — HEMOGLOBIN A1C: Hgb A1c MFr Bld: 5.3 % (ref 4.6–6.5)

## 2024-12-30 MED ORDER — DULOXETINE HCL 30 MG PO CPEP: 30.0000 mg | ORAL_CAPSULE | Freq: Every day | ORAL | 1 refills | Status: AC

## 2024-12-30 MED ORDER — AMPHETAMINE-DEXTROAMPHET ER 20 MG PO CP24: 20.0000 mg | ORAL_CAPSULE | ORAL | 0 refills | Status: AC

## 2024-12-30 MED ORDER — TRAZODONE HCL 100 MG PO TABS: 100.0000 mg | ORAL_TABLET | Freq: Every day | ORAL | 1 refills | Status: AC

## 2024-12-30 MED ORDER — EMGALITY 120 MG/ML ~~LOC~~ SOAJ: SUBCUTANEOUS | 3 refills | Status: AC
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
## 2024-12-30 NOTE — Assessment & Plan Note (Signed)
 Patient with many years of chronic migraines.  Currently she is having about 5 migraine days per week.  No auras.  Associated with nausea and disability.  She has tried and failed treatments with sumatriptan  and which do not offer adequate relief, and has tried prophylaxis with topiramate but had side effects that medication.  She has done well with Emgality  in the past, found it very helpful.  Has been out of it for several months.  Very used to the dosing regimen.  Will reorder Emgality , and continue with sumatriptan  as needed for breakthrough pain.

## 2024-12-30 NOTE — Patient Instructions (Signed)
" °  VISIT SUMMARY: During your visit, we discussed the management of your chronic pain, migraines, ADHD, and the importance of consistent vitamin supplementation following your gastric bypass surgery. We reviewed your current medications and made adjustments to better manage your symptoms.  YOUR PLAN: -CHRONIC NECK AND BACK PAIN WITH POSSIBLE FIBROMYALGIA: Chronic pain in your neck and back may be related to fibromyalgia, a condition characterized by widespread pain and tenderness. We have prescribed Cymbalta  to help manage your pain and discussed the importance of focusing on your functional abilities rather than just pain levels. We also recommended getting an MRI to evaluate your back pain further.  -MIGRAINE: Migraines are severe headaches often accompanied by nausea and sensitivity to light and sound. We have prescribed Emgality  to help prevent your migraines and advised you to continue using Imitrex  for acute attacks and Zofran  for nausea.  -ATTENTION-DEFICIT HYPERACTIVITY DISORDER (ADHD): ADHD is a condition that affects your ability to focus and can cause sleep disturbances. We have prescribed Adderall extended release to help manage your symptoms. We also conducted drug testing and completed an ADHD symptom survey to guide your treatment.  -HISTORY OF BARIATRIC SURGERY: Following your gastric bypass surgery, it is important to consistently take your vitamins to prevent deficiencies. We discussed the importance of taking a multivitamin with iron regularly.  INSTRUCTIONS: Please follow up with the recommended MRI for your back pain evaluation. Continue taking your prescribed medications as directed and ensure you are taking your multivitamin with iron daily. If you experience any side effects or have concerns about your medications, please contact our office.    Contains text generated by [employer].   "

## 2024-12-30 NOTE — Assessment & Plan Note (Signed)
 Chronic issue.  Patient going through a lot of stress right now.  She has chronic pain condition, works as a travel engineer, civil (consulting), has difficulty initiating sleep and currently using trazodone  150 mg on a near nightly basis.  Reports that it is pretty effective for her with no side effects.  A lot of her mood disorder seems to come from her chronic pain experience along with external stressors.  We talked about starting Cymbalta  mostly to help with her pain experience.  She is open to it and we will monitor closely for side effects.  Because of her starting Cymbalta , I do recommend decreasing trazodone  to 100 mg nightly to lower the risk of excess serotonin symptoms.

## 2024-12-30 NOTE — Assessment & Plan Note (Signed)
 Difficult situation of a person with years of chronic neck pain that led to a perioed of pretty advanced polypharmacy, at 1 point using 150 tablets of oxycodone  10 mg each month combined with benzos and Adderall, and still not satisfactory for pain control.  She is now off of opioids and all controlled medications, did have withdrawal when she abruptly had to discontinue the oxycodone .  Her neuroexam today is reassuring.  She has no radicular symptoms.  I recommended against future use of chronic opioids.  We talked about shifting our focus from pain level control to increased function.  She is a very young person.  No high risk or red flag symptoms like fever, trauma, and neuroexam is normal.  I do not think gabapentin  will be helpful because there is no radiculopathy.  I recommended starting Cymbalta , she is open to trying it.  She has had sensitivities to medications in the past so we will have to watch closely for side effects.  She is open to the idea of comorbid fibromyalgia which I think may be a possibility.

## 2024-12-30 NOTE — Assessment & Plan Note (Signed)
 Chronic and uncontrolled currently.  She very often has trouble completing tasks that require a lot of thoughts, very often fidgets and squirms throughout the day, has a very high symptom burden on the ASR S symptom checklist.  Did pretty well with treatment with Adderall for about a year and a half, but then lost that prescription access about 6 months ago.  We talked about reasonable expectations for using these medications.  I recommended an extended release formulation in order to reduce possible side effects like difficulties with sleep which is already an issue for her.  She is willing to try.  Will check a urine drug screen today.  I prescribed Adderall XR 20 mg to her pharmacy.  Hopefully availability will be there.  Follow-up with me in 4 weeks.

## 2024-12-31 ENCOUNTER — Encounter: Payer: Self-pay | Admitting: Student in an Organized Health Care Education/Training Program

## 2024-12-31 LAB — HEPATITIS C ANTIBODY: Hepatitis C Ab: NONREACTIVE

## 2024-12-31 LAB — HIV ANTIBODY (ROUTINE TESTING W REFLEX)
HIV 1&2 Ab, 4th Generation: NONREACTIVE
HIV FINAL INTERPRETATION: NEGATIVE

## 2025-01-02 ENCOUNTER — Ambulatory Visit: Payer: Self-pay | Admitting: Student in an Organized Health Care Education/Training Program

## 2025-01-02 ENCOUNTER — Telehealth: Payer: Self-pay

## 2025-01-02 ENCOUNTER — Other Ambulatory Visit (HOSPITAL_COMMUNITY): Payer: Self-pay

## 2025-01-02 LAB — DRUG MONITORING, PANEL 8 WITH CONFIRMATION, URINE
6 Acetylmorphine: NEGATIVE ng/mL
Alcohol Metabolites: NEGATIVE ng/mL
Amphetamines: NEGATIVE ng/mL
Benzodiazepines: NEGATIVE ng/mL
Buprenorphine, Urine: NEGATIVE ng/mL
Cocaine Metabolite: NEGATIVE ng/mL
Codeine: NEGATIVE ng/mL
Creatinine: 149.1 mg/dL
Hydrocodone: 1329 ng/mL — ABNORMAL HIGH
Hydromorphone: 422 ng/mL — ABNORMAL HIGH
MDMA: NEGATIVE ng/mL
Marijuana Metabolite: NEGATIVE ng/mL
Morphine: NEGATIVE ng/mL
Norhydrocodone: 1974 ng/mL — ABNORMAL HIGH
Opiates: POSITIVE ng/mL — AB
Oxidant: NEGATIVE ug/mL
Oxycodone: NEGATIVE ng/mL
pH: 6.1 (ref 4.5–9.0)

## 2025-01-02 LAB — DM TEMPLATE

## 2025-01-02 NOTE — Telephone Encounter (Signed)
 Patient needs PA for Emgality  120mg /mL has previously tried Topamax, Propranolol, Ubrelvy , and Magnesium

## 2025-01-02 NOTE — Telephone Encounter (Signed)
 Pharmacy Patient Advocate Encounter   Received notification from Physician's Office that prior authorization for Emgality  120MG /ML auto-injectors (migraine) is required/requested.   Insurance verification completed.   The patient is insured through [facility].   Per test claim: PA required; PA submitted to above mentioned insurance via Latent Key/confirmation #/[id number] Status is pending

## 2025-01-03 NOTE — Telephone Encounter (Signed)
 PA approved

## 2025-01-16 ENCOUNTER — Ambulatory Visit: Payer: Self-pay | Admitting: Student in an Organized Health Care Education/Training Program

## 2025-02-03 ENCOUNTER — Ambulatory Visit: Payer: Self-pay | Admitting: Student in an Organized Health Care Education/Training Program
